# Patient Record
Sex: Female | Born: 1985 | Race: Black or African American | Hispanic: No | Marital: Single | Smoking: Former smoker
Health system: Southern US, Community
[De-identification: ages and names within clinical notes are randomized; demographics above are authoritative.]

## PROBLEM LIST (undated history)

## (undated) ENCOUNTER — Inpatient Hospital Stay (HOSPITAL_COMMUNITY): Payer: Self-pay

## (undated) DIAGNOSIS — F329 Major depressive disorder, single episode, unspecified: Secondary | ICD-10-CM

## (undated) DIAGNOSIS — F32A Depression, unspecified: Secondary | ICD-10-CM

## (undated) DIAGNOSIS — N83209 Unspecified ovarian cyst, unspecified side: Secondary | ICD-10-CM

## (undated) DIAGNOSIS — R51 Headache: Secondary | ICD-10-CM

## (undated) DIAGNOSIS — D219 Benign neoplasm of connective and other soft tissue, unspecified: Secondary | ICD-10-CM

---

## 2011-04-22 ENCOUNTER — Emergency Department (HOSPITAL_COMMUNITY)
Admission: EM | Admit: 2011-04-22 | Discharge: 2011-04-22 | Disposition: A | Discharge status: Home / Self Care | Payer: Self-pay | Attending: Emergency Medicine | Admitting: Emergency Medicine

## 2011-04-22 DIAGNOSIS — B9789 Other viral agents as the cause of diseases classified elsewhere: Secondary | ICD-10-CM | POA: Insufficient documentation

## 2011-04-22 DIAGNOSIS — R05 Cough: Secondary | ICD-10-CM | POA: Insufficient documentation

## 2011-04-22 DIAGNOSIS — R059 Cough, unspecified: Secondary | ICD-10-CM | POA: Insufficient documentation

## 2011-04-22 DIAGNOSIS — J029 Acute pharyngitis, unspecified: Secondary | ICD-10-CM | POA: Insufficient documentation

## 2011-04-22 LAB — RAPID STREP SCREEN (MED CTR MEBANE ONLY): Streptococcus, Group A Screen (Direct): NEGATIVE

## 2011-10-27 ENCOUNTER — Other Ambulatory Visit: Payer: Self-pay

## 2011-10-27 ENCOUNTER — Emergency Department (HOSPITAL_COMMUNITY)
Admission: EM | Admit: 2011-10-27 | Discharge: 2011-10-27 | Disposition: A | Discharge status: Home / Self Care | Payer: Self-pay | Attending: Emergency Medicine | Admitting: Emergency Medicine

## 2011-10-27 ENCOUNTER — Emergency Department (HOSPITAL_COMMUNITY): Payer: Self-pay

## 2011-10-27 ENCOUNTER — Encounter (HOSPITAL_COMMUNITY): Payer: Self-pay | Admitting: *Deleted

## 2011-10-27 DIAGNOSIS — R1013 Epigastric pain: Secondary | ICD-10-CM | POA: Insufficient documentation

## 2011-10-27 DIAGNOSIS — R079 Chest pain, unspecified: Secondary | ICD-10-CM | POA: Insufficient documentation

## 2011-10-27 DIAGNOSIS — M549 Dorsalgia, unspecified: Secondary | ICD-10-CM | POA: Insufficient documentation

## 2011-10-27 DIAGNOSIS — Z79899 Other long term (current) drug therapy: Secondary | ICD-10-CM | POA: Insufficient documentation

## 2011-10-27 LAB — COMPREHENSIVE METABOLIC PANEL
Albumin: 3.5 g/dL (ref 3.5–5.2)
Alkaline Phosphatase: 50 U/L (ref 39–117)
BUN: 10 mg/dL (ref 6–23)
CO2: 25 mEq/L (ref 19–32)
Chloride: 106 mEq/L (ref 96–112)
Creatinine, Ser: 0.9 mg/dL (ref 0.50–1.10)
GFR calc Af Amer: >90 mL/min (ref >90)
GFR calc non Af Amer: 88 mL/min — ABNORMAL LOW (ref >90)
Glucose, Bld: 94 mg/dL (ref 70–99)
Potassium: 3.6 mEq/L (ref 3.5–5.1)
Total Bilirubin: 0.2 mg/dL — ABNORMAL LOW (ref 0.3–1.2)

## 2011-10-27 LAB — URINALYSIS, ROUTINE W REFLEX MICROSCOPIC
Bilirubin Urine: NEGATIVE
Ketones, ur: NEGATIVE mg/dL
Leukocytes, UA: NEGATIVE
Nitrite: NEGATIVE
Protein, ur: NEGATIVE mg/dL
pH: 7.5 (ref 5.0–8.0)

## 2011-10-27 LAB — DIFFERENTIAL
Basophils Relative: 1 % (ref 0–1)
Lymphocytes Relative: 43 % (ref 12–46)
Lymphs Abs: 2.5 10*3/uL (ref 0.7–4.0)
Monocytes Absolute: 0.3 10*3/uL (ref 0.1–1.0)
Monocytes Relative: 6 % (ref 3–12)
Neutro Abs: 2.8 10*3/uL (ref 1.7–7.7)
Neutrophils Relative %: 49 % (ref 43–77)

## 2011-10-27 LAB — CBC
HCT: 37.5 % (ref 36.0–46.0)
Hemoglobin: 12.3 g/dL (ref 12.0–15.0)
MCHC: 32.8 g/dL (ref 30.0–36.0)
RBC: 4.23 MIL/uL (ref 3.87–5.11)

## 2011-10-27 LAB — LIPASE, BLOOD: Lipase: 26 U/L (ref 11–59)

## 2011-10-27 MED ORDER — GI COCKTAIL ~~LOC~~
30.0000 mL | Freq: Once | ORAL | Status: AC
  Administered 2011-10-27: 30 mL via ORAL
  Filled 2011-10-27: qty 30

## 2011-10-27 MED ORDER — ONDANSETRON HCL 4 MG/2ML IJ SOLN
4.0000 mg | Freq: Once | INTRAMUSCULAR | Status: AC
  Administered 2011-10-27: 4 mg via INTRAVENOUS
  Filled 2011-10-27: qty 2

## 2011-10-27 MED ORDER — OMEPRAZOLE 20 MG PO CPDR: 20.0000 mg | DELAYED_RELEASE_CAPSULE | Freq: Every day | ORAL | Status: DC

## 2011-10-27 NOTE — ED Notes (Signed)
Patient presents with chest pain, onset last night, patient sts that occurred while eating a broccoli and chicken casserole last pm. Describes as a tight squeezing type pain at tip of sternum and upper abdomen, denies hx of gall stones or sludge, some sob reported and nausea, denies vomiting or diaphoresis.

## 2011-10-27 NOTE — ED Notes (Signed)
Patient feels better. Awaiting reevaluation of md.

## 2011-10-27 NOTE — ED Provider Notes (Signed)
History     CSN: [id number]  Arrival date & time 10/27/11  4098   First MD Initiated Contact with Patient 10/27/11 [phone]      Chief Complaint  Patient presents with  . Chest Pain    (Consider location/radiation/quality/duration/timing/severity/associated sxs/prior treatment) HPI Comments: 26 year old female with epigastric pain and tightness that gets worse when she lays down. This pain started last night she said nothing like this before. It is not associated with any nausea, vomiting, dysuria, hematuria, cough, fever, shortness of breath. She denies any pleuritic nature to the pain. She is not on birth control. Pain is reproducible and upper gastrointestinal or sternum. She denies any injury. No history of gallbladder problems or acid reflux.  The history is provided by the patient.    History reviewed. No pertinent past medical history.  History reviewed. No pertinent past surgical history.  History reviewed. No pertinent family history.  History  Substance Use Topics  . Smoking status: Current Everyday Smoker -- 0.5 packs/day    Types: Cigarettes  . Smokeless tobacco: Not on file  . Alcohol Use: No    OB History    Grav Para Term Preterm Abortions TAB SAB Ect Mult Living                  Review of Systems  Constitutional: Negative for fever and activity change.  HENT: Negative for congestion and rhinorrhea.   Respiratory: Positive for chest tightness. Negative for cough and shortness of breath.   Cardiovascular: Positive for chest pain.  Gastrointestinal: Positive for abdominal pain. Negative for nausea, vomiting and diarrhea.  Genitourinary: Negative for dysuria, vaginal bleeding and vaginal discharge.  Musculoskeletal: Positive for back pain.  Skin: Negative for rash.  Neurological: Negative for headaches.    Allergies  Review of patient's allergies indicates no known allergies.  Home Medications   Current Outpatient Rx  Name Route Sig Dispense Refill    . OMEPRAZOLE 20 MG PO CPDR Oral Take 1 capsule (20 mg total) by mouth daily. 30 capsule 0    BP 107/62  Pulse 54  Resp 23  SpO2 100%  Physical Exam  Constitutional: She is oriented to person, place, and time. She appears well-developed and well-nourished. No distress.  HENT:  Head: Normocephalic and atraumatic.  Mouth/Throat: Oropharynx is clear and moist. No oropharyngeal exudate.  Eyes: Conjunctivae are normal. Pupils are equal, round, and reactive to light.  Neck: Normal range of motion. Neck supple.  Cardiovascular: Normal rate, regular rhythm and normal heart sounds.   Pulmonary/Chest: Effort normal and breath sounds normal. No respiratory distress. She exhibits tenderness.       Tenderness to lower sternum  Abdominal: Soft. There is tenderness. There is no rebound and no guarding.       Epigastric tenderness to palpation, right upper quadrant soft, negative Murphy sign  Musculoskeletal: Normal range of motion. She exhibits no edema and no tenderness.  Neurological: She is alert and oriented to person, place, and time. No cranial nerve deficit.  Skin: Skin is warm.    ED Course  Procedures (including critical care time)  Labs Reviewed  COMPREHENSIVE METABOLIC PANEL - Abnormal; Notable for the following:    Total Bilirubin 0.2 (*)    GFR calc non Af Amer 88 (*)    All other components within normal limits  URINALYSIS, ROUTINE W REFLEX MICROSCOPIC - Abnormal; Notable for the following:    APPearance HAZY (*)    All other components within normal limits  CBC  DIFFERENTIAL  LIPASE, BLOOD  PREGNANCY, URINE   Dg Abd Acute W/chest  10/27/2011  *RADIOLOGY REPORT*  Clinical Data: Epigastric pain.  ACUTE ABDOMEN SERIES (ABDOMEN 2 VIEW & CHEST 1 VIEW)  Comparison: None.  Findings: The chest radiograph demonstrates clear lungs. Heart and mediastinum are within normal limits.  No evidence for free air. There is a small amount of bowel gas in the abdomen.  Stool in the right colon.   One prominent gas filled loop of small bowel in the left upper quadrant is nonspecific.  Calcifications in the left hemi pelvis. There is a 5 mm calcification overlying the medial left kidney.  Findings could represent a renal calculus.  IMPRESSION: 5 mm calcification in the left kidney region.  Findings could represent a renal calculus.  Additional calcifications in left hemi pelvis and cannot exclude a left distal ureter stone.  Nonspecific bowel gas pattern.  No acute chest findings.  Original Report Authenticated By: Richarda Overlie, M.D.     1. Epigastric pain       MDM  Epigastric pain with lower sternal pain. History not consistent for ACS. Concern for gastritis versus GERD versus reflux. PERC negative  Pain improved a GI cocktail. LFTs within normal limits, lipase normal. Urinalysis negative We'll treat with PPI for gastritis/esophagitis and followup with gastroenterology   Date: 10/27/2011  Rate: 64  Rhythm: sinus arrhythmia  QRS Axis: normal  Intervals: normal  ST/T Wave abnormalities: normal  Conduction Disutrbances:none  Narrative Interpretation:   Old EKG Reviewed: none available         Glynn Octave, MD 10/27/11 1721

## 2011-10-27 NOTE — Discharge Instructions (Signed)

## 2011-10-27 NOTE — ED Notes (Signed)
Reports mid chest tightness that started last night, gets worse when she lays down. Denies recent cough or cold. No acute distress noted at triage, ekg being done.

## 2012-03-16 ENCOUNTER — Emergency Department (HOSPITAL_COMMUNITY)
Admission: EM | Admit: 2012-03-16 | Discharge: 2012-03-16 | Disposition: A | Discharge status: Home / Self Care | Payer: Self-pay | Attending: Emergency Medicine | Admitting: Emergency Medicine

## 2012-03-16 ENCOUNTER — Encounter (HOSPITAL_COMMUNITY): Payer: Self-pay | Admitting: Emergency Medicine

## 2012-03-16 DIAGNOSIS — F17200 Nicotine dependence, unspecified, uncomplicated: Secondary | ICD-10-CM | POA: Insufficient documentation

## 2012-03-16 DIAGNOSIS — J029 Acute pharyngitis, unspecified: Secondary | ICD-10-CM | POA: Insufficient documentation

## 2012-03-16 MED ORDER — IBUPROFEN 800 MG PO TABS: 800.0000 mg | ORAL_TABLET | Freq: Three times a day (TID) | ORAL | Status: AC | PRN

## 2012-03-16 MED ORDER — IBUPROFEN 800 MG PO TABS
800.0000 mg | ORAL_TABLET | Freq: Once | ORAL | Status: AC
  Administered 2012-03-16: 800 mg via ORAL
  Filled 2012-03-16: qty 1

## 2012-03-16 NOTE — Discharge Instructions (Signed)
Read the information below.  You may try over the counter products such as Cepacol or Chloraseptic for your sore throat, which will help numb your throat, as well as tylenol and/or ibuprofen for pain.  If you develop high fevers unresponsive to tylenol or ibuprofen (prescribed), if you have any difficulty swallowing or breathing, or are unable to drink fluids or swallow your saliva, return to the ER immediately for a recheck.  You may return to the ER at any time for worsening condition or any new symptoms that concern you.  Pharyngitis, Viral and Bacterial Pharyngitis is soreness (inflammation) or infection of the pharynx. It is also called a sore throat. CAUSES  Most sore throats are caused by viruses and are part of a cold. However, some sore throats are caused by strep and other bacteria. Sore throats can also be caused by post nasal drip from draining sinuses, allergies and sometimes from sleeping with an open mouth. Infectious sore throats can be spread from person to person by coughing, sneezing and sharing cups or eating utensils. TREATMENT  Sore throats that are viral usually last 3-4 days. Viral illness will get better without medications (antibiotics). Strep throat and other bacterial infections will usually begin to get better about 24-48 hours after you begin to take antibiotics. HOME CARE INSTRUCTIONS   If the caregiver feels there is a bacterial infection or if there is a positive strep test, they will prescribe an antibiotic. The full course of antibiotics must be taken. If the full course of antibiotic is not taken, you or your child may become ill again. If you or your child has strep throat and do not finish all of the medication, serious heart or kidney diseases may develop.   Drink enough water and fluids to keep your urine clear or pale yellow.   Only take over-the-counter or prescription medicines for pain, discomfort or fever as directed by your caregiver.   Get lots of rest.     Gargle with salt water ( tsp. of salt in a glass of water) as often as every 1-2 hours as you need for comfort.   Hard candies may soothe the throat if individual is not at risk for choking. Throat sprays or lozenges may also be used.  SEEK MEDICAL CARE IF:   Large, tender lumps in the neck develop.   A rash develops.   Green, yellow-brown or bloody sputum is coughed up.   Your baby is older than 3 months with a rectal temperature of 100.5 F (38.1 C) or higher for more than 1 day.  SEEK IMMEDIATE MEDICAL CARE IF:   A stiff neck develops.   You or your child are drooling or unable to swallow liquids.   You or your child are vomiting, unable to keep medications or liquids down.   You or your child has severe pain, unrelieved with recommended medications.   You or your child are having difficulty breathing (not due to stuffy nose).   You or your child are unable to fully open your mouth.   You or your child develop redness, swelling, or severe pain anywhere on the neck.   You have a fever.   Your baby is older than 3 months with a rectal temperature of 102 F (38.9 C) or higher.   Your baby is 72 months old or younger with a rectal temperature of 100.4 F (38 C) or higher.  MAKE SURE YOU:   Understand these instructions.   Will watch your condition.  Will get help right away if you are not doing well or get worse.  Document Released: 09/23/2005 Document Revised: 09/12/2011 Document Reviewed: 12/21/2007 [facility] Patient Information 2012 [place], [place].  Antibiotic Nonuse  Your caregiver felt that the infection or problem was not one that would be helped with an antibiotic. Infections may be caused by viruses or bacteria. Only a caregiver can tell which one of these is the likely cause of an illness. A cold is the most common cause of infection in both adults and children. A cold is a virus. Antibiotic treatment will have no effect on a viral infection. Viruses can  lead to many lost days of work caring for sick children and many missed days of school. Children may catch as many as 10 "colds" or "flus" per year during which they can be tearful, cranky, and uncomfortable. The goal of treating a virus is aimed at keeping the ill person comfortable. Antibiotics are medications used to help the body fight bacterial infections. There are relatively few types of bacteria that cause infections but there are hundreds of viruses. While both viruses and bacteria cause infection they are very different types of germs. A viral infection will typically go away by itself within 7 to 10 days. Bacterial infections may spread or get worse without antibiotic treatment. Examples of bacterial infections are:  Sore throats (like strep throat or tonsillitis).   Infection in the lung (pneumonia).   Ear and skin infections.  Examples of viral infections are:  Colds or flus.   Most coughs and bronchitis.   Sore throats not caused by Strep.   Runny noses.  It is often best not to take an antibiotic when a viral infection is the cause of the problem. Antibiotics can kill off the helpful bacteria that we have inside our body and allow harmful bacteria to start growing. Antibiotics can cause side effects such as allergies, nausea, and diarrhea without helping to improve the symptoms of the viral infection. Additionally, repeated uses of antibiotics can cause bacteria inside of our body to become resistant. That resistance can be passed onto harmful bacterial. The next time you have an infection it may be harder to treat if antibiotics are used when they are not needed. Not treating with antibiotics allows our own immune system to develop and take care of infections more efficiently. Also, antibiotics will work better for [place] when they are prescribed for bacterial infections. Treatments for a child that is ill may include:  Give extra fluids throughout the day to stay hydrated.   Get  plenty of rest.   Only give your child over-the-counter or prescription medicines for pain, discomfort, or fever as directed by your caregiver.   The use of a cool mist humidifier may help stuffy noses.   Cold medications if suggested by your caregiver.  Your caregiver may decide to start you on an antibiotic if:  The problem you were seen for today continues for a longer length of time than expected.   You develop a secondary bacterial infection.  SEEK MEDICAL CARE IF:  Fever lasts longer than 5 days.   Symptoms continue to get worse after 5 to 7 days or become severe.   Difficulty in breathing develops.   Signs of dehydration develop (poor drinking, rare urinating, dark colored urine).   Changes in behavior or worsening tiredness (listlessness or lethargy).  Document Released: 12/02/2001 Document Revised: 09/12/2011 Document Reviewed: 05/31/2009 [facility] Patient Information 2012 [place], [place].

## 2012-03-16 NOTE — ED Provider Notes (Signed)
Medical screening examination/treatment/procedure(s) were performed by non-physician practitioner and as supervising physician I was immediately available for consultation/collaboration.  Mikenzie Mccannon K Linker, MD 03/16/12 1336 

## 2012-03-16 NOTE — ED Provider Notes (Signed)
History     CSN: [id number]  Arrival date & time 03/16/12  4098   First MD Initiated Contact with Patient 03/16/12 [phone]      Chief Complaint  Patient presents with  . Sore Throat    (Consider location/radiation/quality/duration/timing/severity/associated sxs/prior treatment) HPI Comments: Patient reports she woke up with a "scratchy" throat yesterday morning, states it was not too bad yesterday but overnight she developed body aches, nausea, headaches, and worsening throat pain.  Patient has not taken her temperature and has taken nothing for her symptoms.  Denies cough, SOB, nasal congestion, ear pain.  Denies known sick contacts.    The history is provided by the patient.    History reviewed. No pertinent past medical history.  History reviewed. No pertinent past surgical history.  No family history on file.  History  Substance Use Topics  . Smoking status: Current Everyday Smoker -- 0.5 packs/day    Types: Cigarettes  . Smokeless tobacco: Not on file  . Alcohol Use: No    OB History    Grav Para Term Preterm Abortions TAB SAB Ect Mult Living                  Review of Systems  Constitutional: Negative for fever and chills.  HENT: Positive for sore throat. Negative for ear pain, congestion, rhinorrhea and trouble swallowing.   Respiratory: Negative for cough, shortness of breath, wheezing and stridor.   Musculoskeletal: Positive for myalgias.  Neurological: Positive for headaches.    Allergies  Review of patient's allergies indicates no known allergies.  Home Medications  No current outpatient prescriptions on file.  BP 108/59  Pulse 71  Temp(Src) 99.4 F (37.4 C) (Oral)  Resp 20  SpO2 100%  Physical Exam  Constitutional: She is oriented to person, place, and time. She appears well-developed and well-nourished.  HENT:  Head: Normocephalic and atraumatic.  Right Ear: Tympanic membrane normal.  Left Ear: Tympanic membrane normal.  Mouth/Throat: Uvula  is midline. Mucous membranes are not dry. No uvula swelling. Posterior oropharyngeal edema and posterior oropharyngeal erythema present. No oropharyngeal exudate or tonsillar abscesses.  Neck: Trachea normal, normal range of motion and phonation normal. Neck supple. No tracheal tenderness present. No rigidity. No tracheal deviation and normal range of motion present.  Cardiovascular: Normal rate and regular rhythm.   Pulmonary/Chest: Effort normal and breath sounds normal. No stridor.  Lymphadenopathy:    She has cervical adenopathy.  Neurological: She is alert and oriented to person, place, and time.    ED Course  Procedures (including critical care time)   Labs Reviewed  RAPID STREP SCREEN  STREP A DNA PROBE   No results found.   1. Pharyngitis       MDM  Afebrile, nontoxic patient with mild anterior cervical lymphadenopathy and erythematous throat without exudate.  Abscence of cough.  No known fevers.  Strep screen is negative.  Sent for dna probe.  Pt d/c home with symptomatic medications, encouragement for PO intake, given return precautions.  Patient verbalizes understanding and agrees with plan.          Rise Patience, [place] 03/16/12 1315

## 2012-03-16 NOTE — ED Notes (Signed)
Sore throat and body aches  Since yesterday

## 2012-03-17 LAB — STREP A DNA PROBE

## 2012-03-20 ENCOUNTER — Encounter (HOSPITAL_COMMUNITY): Payer: Self-pay | Admitting: Nurse Practitioner

## 2012-03-20 ENCOUNTER — Emergency Department (HOSPITAL_COMMUNITY)
Admission: EM | Admit: 2012-03-20 | Discharge: 2012-03-20 | Disposition: A | Discharge status: Home / Self Care | Payer: Self-pay | Attending: Emergency Medicine | Admitting: Emergency Medicine

## 2012-03-20 DIAGNOSIS — R112 Nausea with vomiting, unspecified: Secondary | ICD-10-CM | POA: Insufficient documentation

## 2012-03-20 DIAGNOSIS — M2540 Effusion, unspecified joint: Secondary | ICD-10-CM | POA: Insufficient documentation

## 2012-03-20 DIAGNOSIS — F17200 Nicotine dependence, unspecified, uncomplicated: Secondary | ICD-10-CM | POA: Insufficient documentation

## 2012-03-20 DIAGNOSIS — R109 Unspecified abdominal pain: Secondary | ICD-10-CM | POA: Insufficient documentation

## 2012-03-20 DIAGNOSIS — M7989 Other specified soft tissue disorders: Secondary | ICD-10-CM

## 2012-03-20 LAB — DIFFERENTIAL
Basophils Absolute: 0.1 10*3/uL (ref 0.0–0.1)
Eosinophils Absolute: 0.2 10*3/uL (ref 0.0–0.7)
Lymphocytes Relative: 43 % (ref 12–46)
Monocytes Relative: 6 % (ref 3–12)
Neutrophils Relative %: 47 % (ref 43–77)

## 2012-03-20 LAB — CBC
MCHC: 33.9 g/dL (ref 30.0–36.0)
RDW: 12.5 % (ref 11.5–15.5)
WBC: 6.8 10*3/uL (ref 4.0–10.5)

## 2012-03-20 LAB — URINALYSIS, ROUTINE W REFLEX MICROSCOPIC
Ketones, ur: 15 mg/dL — AB
Leukocytes, UA: NEGATIVE
Nitrite: NEGATIVE
Protein, ur: NEGATIVE mg/dL
pH: 6.5 (ref 5.0–8.0)

## 2012-03-20 LAB — POCT I-STAT, CHEM 8
HCT: 33 % — ABNORMAL LOW (ref 36.0–46.0)
Hemoglobin: 11.2 g/dL — ABNORMAL LOW (ref 12.0–15.0)
Potassium: 3.2 mEq/L — ABNORMAL LOW (ref 3.5–5.1)
Sodium: 142 mEq/L (ref 135–145)

## 2012-03-20 LAB — POCT PREGNANCY, URINE: Preg Test, Ur: NEGATIVE

## 2012-03-20 MED ORDER — ONDANSETRON 4 MG PO TBDP
8.0000 mg | ORAL_TABLET | Freq: Once | ORAL | Status: AC
  Administered 2012-03-20: 8 mg via ORAL
  Filled 2012-03-20: qty 2

## 2012-03-20 NOTE — ED Notes (Signed)
Pt reports nausea and abd pain as well as L leg swelling x 1 week. Pt denies any injuries to leg, describes it as "Feeling tight."

## 2012-03-20 NOTE — Discharge Instructions (Signed)
Your blood work and urine analysis did not show any significant findings. Keep your legs elevated at home. Drink plenty of fluids. If your leg becomes swollen and does not go down, return to emergency department. Return to ER if chest pain, short of breath, fever, or any other concerning symptoms.   Peripheral Edema You have swelling in your legs (peripheral edema). This swelling is due to excess accumulation of salt and water in your body. Edema may be a sign of heart, kidney or liver disease, or a side effect of a medication. It may also be due to problems in the leg veins. Elevating your legs and using special support stockings may be very helpful, if the cause of the swelling is due to poor venous circulation. Avoid long periods of standing, whatever the cause. Treatment of edema depends on identifying the cause. Chips, pretzels, pickles and other salty foods should be avoided. Restricting salt in your diet is almost always needed. Water pills (diuretics) are often used to remove the excess salt and water from your body via urine. These medicines prevent the kidney from reabsorbing sodium. This increases urine flow. Diuretic treatment may also result in lowering of potassium levels in your body. Potassium supplements may be needed if you have to use diuretics daily. Daily weights can help you keep track of your progress in clearing your edema. You should call your caregiver for follow up care as recommended. SEEK IMMEDIATE MEDICAL CARE IF:   You have increased swelling, pain, redness, or heat in your legs.   You develop shortness of breath, especially when lying down.   You develop chest or abdominal pain, weakness, or fainting.   You have a fever.  Document Released: 10/31/2004 Document Revised: 09/12/2011 Document Reviewed: 10/11/2009 [facility] Patient Information 2012 [place], [place].

## 2012-03-20 NOTE — ED Notes (Signed)
Pt discharged via teach back. 

## 2012-03-20 NOTE — ED Provider Notes (Signed)
History     CSN: [id number]  Arrival date & time 03/20/12  1743   First MD Initiated Contact with Patient 03/20/12 2002      Chief Complaint  Patient presents with  . Abdominal Pain  . Leg Swelling    (Consider location/radiation/quality/duration/timing/severity/associated sxs/prior treatment) Patient is a 26 y.o. female presenting with abdominal pain. The history is provided by the patient.  Abdominal Pain The primary symptoms of the illness include abdominal pain, nausea and vomiting. The primary symptoms of the illness do not include fever, fatigue, shortness of breath, dysuria, vaginal discharge or vaginal bleeding. The current episode started more than 2 days ago. The onset of the illness was gradual.  Symptoms associated with the illness do not include urgency or frequency.  Pt states she has had viral URI for the last week. States this has been assicated with nausea, and at times vomiting. Reports nasal congestion, sore throat. States yesterday noted that her left leg was more swollen and "tight feeling" then right. States this now resolved. Pt reports lower abdominal "cramping." Denies being pregnancy. Last menstrual cycle a week ago. Pt denies recent travel, no recent surgeries, not on birth control. No injury to the leg.   History reviewed. No pertinent past medical history.  History reviewed. No pertinent past surgical history.  History reviewed. No pertinent family history.  History  Substance Use Topics  . Smoking status: Current Everyday Smoker -- 0.2 packs/day    Types: Cigarettes  . Smokeless tobacco: Not on file  . Alcohol Use: No    OB History    Grav Para Term Preterm Abortions TAB SAB Ect Mult Living                  Review of Systems  Constitutional: Negative for fever and fatigue.  HENT: Positive for congestion, sore throat and sinus pressure. Negative for neck pain and neck stiffness.   Respiratory: Negative for chest tightness and shortness of  breath.   Cardiovascular: Negative.   Gastrointestinal: Positive for nausea, vomiting and abdominal pain.  Genitourinary: Negative for dysuria, urgency, frequency, flank pain, vaginal bleeding and vaginal discharge.  Musculoskeletal: Positive for joint swelling.  Skin: Negative.   Neurological: Negative for dizziness, weakness and light-headedness.    Allergies  Review of patient's allergies indicates no known allergies.  Home Medications   Current Outpatient Rx  Name Route Sig Dispense Refill  . IBUPROFEN 800 MG PO TABS Oral Take 1 tablet (800 mg total) by mouth every 8 (eight) hours as needed for pain or fever. 15 tablet 0    BP 137/86  Pulse 83  Temp 98.7 F (37.1 C) (Oral)  Resp 18  Ht 5\' 6"  (1.676 m)  Wt 160 lb (72.576 kg)  BMI 25.82 kg/m2  SpO2 100%  Physical Exam  Nursing note and vitals reviewed. Constitutional: She is oriented to person, place, and time. She appears well-developed and well-nourished. No distress.  HENT:  Head: Normocephalic and atraumatic.  Nose: Rhinorrhea present.  Mouth/Throat: Uvula is midline, oropharynx is clear and moist and mucous membranes are normal.  Neck: Normal range of motion. Neck supple.  Cardiovascular: Normal rate, regular rhythm and normal heart sounds.   Pulmonary/Chest: Effort normal and breath sounds normal. No respiratory distress. She has no wheezes.  Musculoskeletal: She exhibits no edema and no tenderness.       Legs appear normal and equal in size bilaterally with no swelling, redness. No calf tenderness bilaterally. No pain with bilateral ankle dorsiflexion -  negative homan's sign  Lymphadenopathy:    She has no cervical adenopathy.  Neurological: She is alert and oriented to person, place, and time.  Skin: Skin is warm and dry.  Psychiatric: She has a normal mood and affect.    ED Course  Procedures (including critical care time)  Abdomen soft on exam, non tender. No swelling of extremities noted currently. Pt  in no distress. VS all within normal. Labs and UA pending.   Results for orders placed during the hospital encounter of 03/20/12  URINALYSIS, ROUTINE W REFLEX MICROSCOPIC      Component Value Range   Color, Urine YELLOW  YELLOW   APPearance CLEAR  CLEAR   Specific Gravity, Urine 1.020  1.005 - 1.030   pH 6.5  5.0 - 8.0   Glucose, UA NEGATIVE  NEGATIVE mg/dL   Hgb urine dipstick NEGATIVE  NEGATIVE   Bilirubin Urine NEGATIVE  NEGATIVE   Ketones, ur 15 (*) NEGATIVE mg/dL   Protein, ur NEGATIVE  NEGATIVE mg/dL   Urobilinogen, UA 2.0 (*) 0.0 - 1.0 mg/dL   Nitrite NEGATIVE  NEGATIVE   Leukocytes, UA NEGATIVE  NEGATIVE  CBC      Component Value Range   WBC 6.8  4.0 - 10.5 K/uL   RBC 3.72 (*) 3.87 - 5.11 MIL/uL   Hemoglobin 11.1 (*) 12.0 - 15.0 g/dL   HCT 10.2 (*) 72.5 - 36.6 %   MCV 87.9  78.0 - 100.0 fL   MCH 29.8  26.0 - 34.0 pg   MCHC 33.9  30.0 - 36.0 g/dL   RDW 44.0  34.7 - 42.5 %   Platelets 245  150 - 400 K/uL  DIFFERENTIAL      Component Value Range   Neutrophils Relative 47  43 - 77 %   Lymphocytes Relative 43  12 - 46 %   Monocytes Relative 6  3 - 12 %   Eosinophils Relative 3  0 - 5 %   Basophils Relative 1  0 - 1 %   Neutro Abs 3.2  1.7 - 7.7 K/uL   Lymphs Abs 2.9  0.7 - 4.0 K/uL   Monocytes Absolute 0.4  0.1 - 1.0 K/uL   Eosinophils Absolute 0.2  0.0 - 0.7 K/uL   Basophils Absolute 0.1  0.0 - 0.1 K/uL   Smear Review FEW ATYPICAL LYMPHS NOTED    POCT I-STAT, CHEM 8      Component Value Range   Sodium 142  135 - 145 mEq/L   Potassium 3.2 (*) 3.5 - 5.1 mEq/L   Chloride 105  96 - 112 mEq/L   BUN 7  6 - 23 mg/dL   Creatinine, Ser 9.56  0.50 - 1.10 mg/dL   Glucose, Bld 93  70 - 99 mg/dL   Calcium, Ion 3.87  5.64 - 1.32 mmol/L   TCO2 22  0 - 100 mmol/L   Hemoglobin 11.2 (*) 12.0 - 15.0 g/dL   HCT 33.2 (*) 95.1 - 88.4 %  POCT PREGNANCY, URINE      Component Value Range   Preg Test, Ur NEGATIVE  NEGATIVE   No results found.  No significant findings to explain  pt's symptoms. Pt denies vaginal discharge or bleeding. No currently symptoms. Will d/c home. No current leg swelling, doubt DVT. Precautions and symptoms discussed with pt which should prompt her to return.   1. Swelling of left lower extremity   2. Abdominal pain       MDM  Lottie Mussel, PA 03/21/12 [phone]

## 2012-03-20 NOTE — ED Notes (Signed)
Here with 2 minor children, here for lower bilateral abd cramping, "mild", (denies: fever, nvd, back pain, urinary or vaginal sx, bleeding or other sx), does mention "some urinary frequency, but also drinks a lot of soda", last BM yesterday (normal), last ate this evening ("no problems"). L leg unremarkable, not tight, no markings, discoloration or swelling. Skin intact. CMS & ROM intact. pulses and skin temp equal.

## 2012-03-20 NOTE — ED Notes (Signed)
Pt states she is going outside for some fresh air.

## 2012-03-22 NOTE — ED Provider Notes (Signed)
Medical screening examination/treatment/procedure(s) were performed by non-physician practitioner and as supervising physician I was immediately available for consultation/collaboration.  Gerhard Munch, MD 03/22/12 [phone]

## 2012-04-11 ENCOUNTER — Encounter (HOSPITAL_COMMUNITY): Payer: Self-pay | Admitting: Emergency Medicine

## 2012-04-11 ENCOUNTER — Emergency Department (HOSPITAL_COMMUNITY)
Admission: EM | Admit: 2012-04-11 | Discharge: 2012-04-11 | Disposition: A | Discharge status: Home / Self Care | Payer: Self-pay | Attending: Emergency Medicine | Admitting: Emergency Medicine

## 2012-04-11 DIAGNOSIS — M7989 Other specified soft tissue disorders: Secondary | ICD-10-CM | POA: Insufficient documentation

## 2012-04-11 DIAGNOSIS — F17200 Nicotine dependence, unspecified, uncomplicated: Secondary | ICD-10-CM | POA: Insufficient documentation

## 2012-04-11 MED ORDER — ENOXAPARIN SODIUM 100 MG/ML ~~LOC~~ SOLN: 1.5000 mg/kg | Freq: Once | SUBCUTANEOUS | Status: DC

## 2012-04-11 MED ORDER — ENOXAPARIN SODIUM 120 MG/0.8ML ~~LOC~~ SOLN
1.5000 mg/kg | Freq: Once | SUBCUTANEOUS | Status: AC
  Administered 2012-04-11: 110 mg via SUBCUTANEOUS
  Filled 2012-04-11: qty 0.8

## 2012-04-11 NOTE — ED Notes (Signed)
Reviewed the need to come to the ED registration in the morning to have her vascular study done.

## 2012-04-11 NOTE — ED Provider Notes (Signed)
History     CSN: [id number]  Arrival date & time 04/11/12  1453   First MD Initiated Contact with Patient 04/11/12 1849      Chief Complaint  Patient presents with  . Leg Swelling    left leg     The history is provided by the patient.   the patient reports 3 weeks of intermittent left lower leg swelling.  She has no history of DVT or pulmonary embolism.  She's had no recent surgery or long travel.  She reports the swelling worsens with sitting and standing up for long periods of time.  She has discomfort in her left lower leg it radiates up into her thigh.  She denies fevers or chills.  She's had no vomiting or diarrhea.  She denies redness or rash to her left lower extremity.  Her pain is mild at this time.  Nothing improves her symptoms  History reviewed. No pertinent past medical history.  History reviewed. No pertinent past surgical history.  History reviewed. No pertinent family history.  History  Substance Use Topics  . Smoking status: Current Everyday Smoker -- 0.2 packs/day    Types: Cigarettes  . Smokeless tobacco: Not on file  . Alcohol Use: No    OB History    Grav Para Term Preterm Abortions TAB SAB Ect Mult Living                  Review of Systems  All other systems reviewed and are negative.    Allergies  Review of patient's allergies indicates no known allergies.  Home Medications  No current outpatient prescriptions on file.  BP 126/78  Pulse 72  Temp 98.5 F (36.9 C) (Oral)  Resp 14  SpO2 100%  LMP 04/04/2012  Physical Exam  Nursing note and vitals reviewed. Constitutional: She is oriented to person, place, and time. She appears well-developed and well-nourished. No distress.  HENT:  Head: Normocephalic and atraumatic.  Eyes: EOM are normal.  Neck: Normal range of motion.  Cardiovascular: Normal rate, regular rhythm and normal heart sounds.   Pulmonary/Chest: Effort normal and breath sounds normal.  Abdominal: Soft. She exhibits no  distension. There is no tenderness.  Musculoskeletal: Normal range of motion.       Mild left lower extremity swelling as compared to the right.  Normal pulses in her left lower extremity.  There is no erythema warmth or tenderness of her left thigh or left lower leg.  She has full range of motion of her joints.  Neurological: She is alert and oriented to person, place, and time.  Skin: Skin is warm and dry.  Psychiatric: She has a normal mood and affect. Judgment normal.    ED Course  Procedures (including critical care time)  Labs Reviewed - No data to display No results found.   1. Swelling of left lower extremity       MDM  My suspicion is that this is venous insufficiency.  The patient will return in the morning for the lower extremity duplex of her left lower extremity.  She's given a single dose Lovenox in the emergency department.  She has no chest pain shortness breath to suggest pulmonary embolism.  I recommended elevation anti-inflammatories and compression stockings        Lyanne Co, MD 04/11/12 2019

## 2012-04-11 NOTE — ED Notes (Signed)
Pt presents to department for evaluation of L leg pain and swelling. Symptoms ongoing x3 weeks. States pain to L calf that radiates up to thigh area. States she was seen for same recently. 6/10 pain, becomes worse with walking. Pedal pulses present. Sensation intact. Pt ambulatory without difficulty. No bruising/swelling noted upon exam. Pt denies recent injury to leg. She is alert and oriented x4. No signs of distress noted.

## 2012-04-11 NOTE — ED Notes (Signed)
Pt reports 3 week history of left leg swelling. Pt seen here for same and told to return if occurs again. Pt reports swelling occurs after sitting down for 2-3 hours at a time. Pt reports swelling starts at lower leg and radiates to upper thigh. Pt denies shortness of breath. Reports nausea.

## 2012-04-12 ENCOUNTER — Ambulatory Visit (HOSPITAL_COMMUNITY)
Admission: RE | Admit: 2012-04-12 | Discharge: 2012-04-12 | Disposition: A | Discharge status: Home / Self Care | Payer: Self-pay | Source: Ambulatory Visit | Attending: Emergency Medicine | Admitting: Emergency Medicine

## 2012-04-12 DIAGNOSIS — M7989 Other specified soft tissue disorders: Secondary | ICD-10-CM | POA: Insufficient documentation

## 2012-04-12 NOTE — Progress Notes (Signed)
VASCULAR LAB PRELIMINARY  PRELIMINARY  PRELIMINARY  PRELIMINARY  Left lower extremity venous Doppler completed.    Preliminary report:  There is no DVT or SVT noted in the left lower extremity.  Bethan Adamek, 04/12/2012, 8:58 AM

## 2012-05-28 ENCOUNTER — Encounter (HOSPITAL_COMMUNITY): Payer: Self-pay

## 2012-05-28 ENCOUNTER — Emergency Department (HOSPITAL_COMMUNITY)
Admission: EM | Admit: 2012-05-28 | Discharge: 2012-05-29 | Disposition: A | Discharge status: Home / Self Care | Payer: Self-pay | Attending: Emergency Medicine | Admitting: Emergency Medicine

## 2012-05-28 DIAGNOSIS — R112 Nausea with vomiting, unspecified: Secondary | ICD-10-CM | POA: Insufficient documentation

## 2012-05-28 LAB — URINALYSIS, ROUTINE W REFLEX MICROSCOPIC
Glucose, UA: NEGATIVE mg/dL
Hgb urine dipstick: NEGATIVE
Leukocytes, UA: NEGATIVE
Protein, ur: NEGATIVE mg/dL
Specific Gravity, Urine: 1.027 (ref 1.005–1.030)
Urobilinogen, UA: 1 mg/dL (ref 0.0–1.0)

## 2012-05-28 LAB — POCT PREGNANCY, URINE: Preg Test, Ur: NEGATIVE

## 2012-05-28 NOTE — ED Notes (Signed)
Pt updated on care 

## 2012-05-28 NOTE — ED Notes (Signed)
Called pt in triage and main waiting room with no answer

## 2012-05-28 NOTE — ED Notes (Signed)
Pt reports N/V/D starting Tuesday 0230 am, pt reports nausea returned today after eating breakfast. Pt denies abd pain

## 2012-07-13 ENCOUNTER — Encounter (HOSPITAL_COMMUNITY): Payer: Self-pay | Admitting: *Deleted

## 2012-07-13 ENCOUNTER — Emergency Department (HOSPITAL_COMMUNITY)
Admission: EM | Admit: 2012-07-13 | Discharge: 2012-07-13 | Disposition: A | Discharge status: Home / Self Care | Payer: Self-pay | Attending: Emergency Medicine | Admitting: Emergency Medicine

## 2012-07-13 DIAGNOSIS — L259 Unspecified contact dermatitis, unspecified cause: Secondary | ICD-10-CM | POA: Insufficient documentation

## 2012-07-13 DIAGNOSIS — F17200 Nicotine dependence, unspecified, uncomplicated: Secondary | ICD-10-CM | POA: Insufficient documentation

## 2012-07-13 DIAGNOSIS — L309 Dermatitis, unspecified: Secondary | ICD-10-CM

## 2012-07-13 NOTE — ED Notes (Signed)
No difficulty breathing 

## 2012-07-13 NOTE — Discharge Instructions (Signed)
 Contact Dermatitis Contact dermatitis is a reaction to certain substances that touch the skin. Contact dermatitis can be either irritant contact dermatitis or allergic contact dermatitis. Irritant contact dermatitis does not require previous exposure to the substance for a reaction to occur.Allergic contact dermatitis only occurs if you have been exposed to the substance before. Upon a repeat exposure, your body reacts to the substance.  CAUSES  Many substances can cause contact dermatitis. Irritant dermatitis is most commonly caused by repeated exposure to mildly irritating substances, such as:  Makeup.  Soaps.  Detergents.  Bleaches.  Acids.  Metal salts, such as nickel. Allergic contact dermatitis is most commonly caused by exposure to:  Poisonous plants.  Chemicals (deodorants, shampoos).  Jewelry.  Latex.  Neomycin in triple antibiotic cream.  Preservatives in products, including clothing. SYMPTOMS  The area of skin that is exposed may develop:  Dryness or flaking.  Redness.  Cracks.  Itching.  Pain or a burning sensation.  Blisters. With allergic contact dermatitis, there may also be swelling in areas such as the eyelids, mouth, or genitals.  DIAGNOSIS  Your caregiver can usually tell what the problem is by doing a physical exam. In cases where the cause is uncertain and an allergic contact dermatitis is suspected, a patch skin test may be performed to help determine the cause of your dermatitis. TREATMENT Treatment includes protecting the skin from further contact with the irritating substance by avoiding that substance if possible. Barrier creams, powders, and gloves may be helpful. Your caregiver may also recommend:  Steroid creams or ointments applied 2 times daily. For best results, soak the rash area in cool water for 20 minutes. Then apply the medicine. Cover the area with a plastic wrap. You can store the steroid cream in the refrigerator for a chilly  effect on your rash. That may decrease itching. Oral steroid medicines may be needed in more severe cases.  Antibiotics or antibacterial ointments if a skin infection is present.  Antihistamine lotion or an antihistamine taken by mouth to ease itching.  Lubricants to keep moisture in your skin.  Burow's solution to reduce redness and soreness or to dry a weeping rash. Mix one packet or tablet of solution in 2 cups cool water. Dip a clean washcloth in the mixture, wring it out a bit, and put it on the affected area. Leave the cloth in place for 30 minutes. Do this as often as possible throughout the day.  Taking several cornstarch or baking soda baths daily if the area is too large to cover with a washcloth. Harsh chemicals, such as alkalis or acids, can cause skin damage that is like a burn. You should flush your skin for 15 to 20 minutes with cold water after such an exposure. You should also seek immediate medical care after exposure. Bandages (dressings), antibiotics, and pain medicine may be needed for severely irritated skin.  HOME CARE INSTRUCTIONS  Avoid the substance that caused your reaction.  Keep the area of skin that is affected away from hot water, soap, sunlight, chemicals, acidic substances, or anything else that would irritate your skin.  Do not scratch the rash. Scratching may cause the rash to become infected.  You may take cool baths to help stop the itching.  Only take over-the-counter or prescription medicines as directed by your caregiver.  See your caregiver for follow-up care as directed to make sure your skin is healing properly. SEEK MEDICAL CARE IF:   Your condition is not better after 3  days of treatment.  You seem to be getting worse.  You see signs of infection such as swelling, tenderness, redness, soreness, or warmth in the affected area.  You have any problems related to your medicines. Document Released: 09/20/2000 Document Revised: 12/16/2011  Document Reviewed: 02/26/2011 [facility] Patient Information 2013 [place], [place].      Use hydrocortisone cream and benadryl cream for itching.  You may take 1-2 tablets of benadryl for itching every 6 hours as needed.  It may cause drowsiness so use with caution.

## 2012-07-13 NOTE — ED Notes (Signed)
The pt has had hives since 1100am today after her break  She has hives on her lt flank and down her lt thigh

## 2012-07-13 NOTE — ED Provider Notes (Signed)
History   This chart was scribed for Misty Shaw. Misty Lamas, MD by Lynelle Smoke. The patient was seen in room TR06C/TR06C. Patient's care was started at 1917.   CSN: [id number]  Arrival date & time 07/13/12  2956   None     Chief Complaint  Patient presents with  . Urticaria    (Consider location/radiation/quality/duration/timing/severity/associated sxs/prior treatment) The history is provided by the patient. No language interpreter was used.  Misty Shaw is a 26 y.o. female who presents to the Emergency Department complaining of a allergic reaction on the left side of her lower abdomin and down her left upper thigh onset this afternoon at work. An associated symptom of the rash is itching. Pt denies smoking and alcohol use.    History reviewed. No pertinent past medical history.  History reviewed. No pertinent past surgical history.  History reviewed. No pertinent family history.  History  Substance Use Topics  . Smoking status: Current Every Day Smoker -- 0.2 packs/day    Types: Cigarettes  . Smokeless tobacco: Not on file  . Alcohol Use: No    OB History    Grav Para Term Preterm Abortions TAB SAB Ect Mult Living                  Review of Systems  Constitutional: Negative for fever and chills.  Gastrointestinal: Negative for nausea and vomiting.  Skin: Positive for rash.    Allergies  Review of patient's allergies indicates no known allergies.  Home Medications  No current outpatient prescriptions on file.  Triage Vitals: BP 111/62  Pulse 77  Temp 98.3 F (36.8 C) (Oral)  Resp 18  SpO2 100%  LMP 06/29/2012  Physical Exam  Nursing note and vitals reviewed. Constitutional: She is oriented to person, place, and time. She appears well-developed and well-nourished. No distress.  HENT:  Head: Normocephalic and atraumatic.  Eyes: EOM are normal.  Neck: Neck supple. No tracheal deviation present.  Cardiovascular: Normal rate.   Pulmonary/Chest:  Effort normal. No respiratory distress.  Musculoskeletal: Normal range of motion.  Neurological: She is alert and oriented to person, place, and time.  Skin: Skin is warm and dry.       Multiple discrete raised erythematous pruritic lesions measuring less than 0.5 cm located left flank and left lateral hip.  Psychiatric: She has a normal mood and affect. Her behavior is normal.    ED Course  Procedures (including critical care time) DIAGNOSTIC STUDIES: Oxygen Saturation is 100% on room air, normal by my interpretation.    COORDINATION OF CARE:  8:45PM- Patient informed of clinical course, understand medical decision-making process, and agree with plan.   Labs Reviewed - No data to display No results found.   1. Dermatitis       MDM  I personally performed the services described in this documentation, which was scribed in my presence. The recorded information has been reviewed and considered.  No evidence of systemic reaction.  No SOB, resp distress.  Possibly insect bites or local dermatitis and focal allergic reaction.  I think amenable to topical hydrocortisone which pt is agreeable to.     Misty Shaw. Eliyas Suddreth, MD 07/14/12 2130

## 2012-07-14 ENCOUNTER — Encounter (HOSPITAL_COMMUNITY): Payer: Self-pay | Admitting: Emergency Medicine

## 2012-07-24 ENCOUNTER — Emergency Department (HOSPITAL_COMMUNITY)
Admission: EM | Admit: 2012-07-24 | Discharge: 2012-07-24 | Disposition: A | Discharge status: Home / Self Care | Payer: Self-pay | Attending: Emergency Medicine | Admitting: Emergency Medicine

## 2012-07-24 ENCOUNTER — Encounter (HOSPITAL_COMMUNITY): Payer: Self-pay

## 2012-07-24 DIAGNOSIS — N760 Acute vaginitis: Secondary | ICD-10-CM

## 2012-07-24 DIAGNOSIS — K5900 Constipation, unspecified: Secondary | ICD-10-CM | POA: Insufficient documentation

## 2012-07-24 DIAGNOSIS — F17200 Nicotine dependence, unspecified, uncomplicated: Secondary | ICD-10-CM | POA: Insufficient documentation

## 2012-07-24 LAB — URINALYSIS, ROUTINE W REFLEX MICROSCOPIC
Bilirubin Urine: NEGATIVE
Ketones, ur: NEGATIVE mg/dL
Leukocytes, UA: NEGATIVE
Nitrite: NEGATIVE
Specific Gravity, Urine: 1.014 (ref 1.005–1.030)
Urobilinogen, UA: 1 mg/dL (ref 0.0–1.0)
pH: 7 (ref 5.0–8.0)

## 2012-07-24 LAB — WET PREP, GENITAL: Trich, Wet Prep: NONE SEEN

## 2012-07-24 MED ORDER — IBUPROFEN 600 MG PO TABS: 600.0000 mg | ORAL_TABLET | Freq: Four times a day (QID) | ORAL | Status: DC | PRN

## 2012-07-24 MED ORDER — METRONIDAZOLE 500 MG PO TABS: 500.0000 mg | ORAL_TABLET | Freq: Two times a day (BID) | ORAL | Status: DC

## 2012-07-24 MED ORDER — IBUPROFEN 200 MG PO TABS
600.0000 mg | ORAL_TABLET | Freq: Once | ORAL | Status: AC
  Administered 2012-07-24: 600 mg via ORAL
  Filled 2012-07-24: qty 3

## 2012-07-24 MED ORDER — POLYETHYLENE GLYCOL 3350 17 G PO PACK: 17.0000 g | PACK | Freq: Every day | ORAL | Status: DC | PRN

## 2012-07-24 NOTE — ED Provider Notes (Signed)
I saw and evaluated the patient, reviewed the resident's note and I agree with the findings and plan.   .Face to face Exam:  General:  Awake HEENT:  Atraumatic Resp:  Normal effort Abd:  Nondistended Neuro:No focal weakness Lymph: No adenopathy   Nelia Shi, MD 07/24/12 1732

## 2012-07-24 NOTE — ED Notes (Addendum)
Pt c/o abdominal cramping x 2 days.  Her period ended 3 days ago.  Has had clear vaginal d/c since as well.  Has been having urinary frequency x last 2-3 weeks. Also c/o h/a's

## 2012-07-24 NOTE — ED Provider Notes (Addendum)
History     CSN: [id number]  Arrival date & time 07/24/12  1506   First MD Initiated Contact with Patient 07/24/12 1530      Chief Complaint  Patient presents with  . Abdominal Cramping  . Vaginal Discharge    clear in color    (Consider location/radiation/quality/duration/timing/severity/associated sxs/prior treatment) HPI  26yo AAF w/ 2 day h/o lower abdominla pain and clear vaginal discharge. Pt finished menstrual period 4 days ago. Pain is crampy in nature and is relieved w/ rest adn loose fitting clothes. Worse w/ sitting and movement while at work. Some nausea when pain is at its worst. Constipation w/ BM Q 2-3 days between. BM today. Denies any vaginal pain, h/o STD, new partners. Pt is sexually active but does not protect against STDs. Has not tried anything for pain. Denies sick contacts or fmhx of abdominal conditions.  History reviewed. No pertinent past medical history.  History reviewed. No pertinent past surgical history.  History reviewed. No pertinent family history.  History  Substance Use Topics  . Smoking status: Current Every Day Smoker -- 0.2 packs/day    Types: Cigarettes  . Smokeless tobacco: Not on file  . Alcohol Use: No    OB History    Grav Para Term Preterm Abortions TAB SAB Ect Mult Living                  Review of Systems  Constitutional: Negative for fever and appetite change.  Gastrointestinal: Positive for nausea, abdominal pain and constipation. Negative for vomiting, diarrhea and blood in stool.  Genitourinary: Positive for vaginal discharge. Negative for dysuria, urgency, vaginal bleeding, vaginal pain and menstrual problem.  Musculoskeletal: Negative for arthralgias.  Skin: Negative for rash.  All other systems reviewed and are negative.    Allergies  Review of patient's allergies indicates no known allergies.  Home Medications   Current Outpatient Rx  Name Route Sig Dispense Refill  . IBUPROFEN 600 MG PO TABS Oral Take  1 tablet (600 mg total) by mouth every 6 (six) hours as needed for pain. 30 tablet 0  . POLYETHYLENE GLYCOL 3350 PO PACK Oral Take 17 g by mouth daily as needed. 14 each 0    BP 111/68  Pulse 79  Temp 99 F (37.2 C) (Oral)  Resp 16  SpO2 100%  LMP 07/16/2012  Physical Exam  Constitutional: She appears well-developed and well-nourished. No distress.  HENT:  Head: Normocephalic.  Eyes: EOM are normal.  Neck: Normal range of motion.  Cardiovascular: Normal rate, regular rhythm, normal heart sounds and intact distal pulses.   Pulmonary/Chest: Effort normal and breath sounds normal.  Abdominal: Soft. She exhibits no distension.       Mild tenderness on deep palpation w/ stool felt in the L quadrants  Musculoskeletal: Normal range of motion.  Neurological: She is alert.  Skin: Skin is warm and dry. No rash noted. No erythema.  Psychiatric: She has a normal mood and affect. Her behavior is normal.    ED Course  Procedures (including critical care time)  Labs Reviewed  URINALYSIS, ROUTINE W REFLEX MICROSCOPIC - Abnormal; Notable for the following:    APPearance CLOUDY (*)     All other components within normal limits  WET PREP, GENITAL - Abnormal; Notable for the following:    Clue Cells Wet Prep HPF POC FEW (*)     WBC, Wet Prep HPF POC RARE (*)     All other components within normal limits  GC/CHLAMYDIA  PROBE AMP, GENITAL  HIV ANTIBODY (ROUTINE TESTING)  RPR   No results found.   1. Constipation       MDM  4:42pm 26yo AAF w/ abdominal pain and vaginal discharge. Concern for constipation vs STD vs gastroenteritis. UA not suggestive of UTI - GC/Chl, RPR, HIV, Wet prep   5:33 PM Wet prep consistent w/ BV - [facility] - other + labs to be called into pt. - Pt to use miralax prn to increase BM to daily - handout given          Ozella Rocks, MD 07/24/12 1730  Ozella Rocks, MD 07/24/12 [phone]

## 2012-07-24 NOTE — ED Notes (Signed)
Pt stable at time of discharge; ambulated out of ER; papers given and stated understanding

## 2012-07-24 NOTE — ED Provider Notes (Signed)
I saw and evaluated the patient, reviewed the resident's note and I agree with the findings and plan.   .Face to face Exam:  General:  Awake HEENT:  Atraumatic Resp:  Normal effort Abd:  Nondistended Neuro:No focal weakness Lymph: No adenopathy   Nelia Shi, MD 07/24/12 1736

## 2012-09-03 ENCOUNTER — Emergency Department (HOSPITAL_COMMUNITY)
Admission: EM | Admit: 2012-09-03 | Discharge: 2012-09-03 | Disposition: A | Discharge status: Home / Self Care | Payer: Self-pay | Attending: Emergency Medicine | Admitting: Emergency Medicine

## 2012-09-03 ENCOUNTER — Encounter (HOSPITAL_COMMUNITY): Payer: Self-pay | Admitting: Emergency Medicine

## 2012-09-03 DIAGNOSIS — R21 Rash and other nonspecific skin eruption: Secondary | ICD-10-CM | POA: Insufficient documentation

## 2012-09-03 DIAGNOSIS — L299 Pruritus, unspecified: Secondary | ICD-10-CM | POA: Insufficient documentation

## 2012-09-03 DIAGNOSIS — F17200 Nicotine dependence, unspecified, uncomplicated: Secondary | ICD-10-CM | POA: Insufficient documentation

## 2012-09-03 MED ORDER — CLOTRIMAZOLE-BETAMETHASONE 1-0.05 % EX CREA: TOPICAL_CREAM | CUTANEOUS | Status: DC

## 2012-09-03 NOTE — ED Notes (Signed)
Pt with circular rash to right arm and right hand that thinks may be ringworm that is itching

## 2012-09-03 NOTE — ED Provider Notes (Signed)
History  This chart was scribed for Benny Lennert, MD by Erskine Emery, ED Scribe. This patient was seen in room TR08C/TR08C and the patient's care was started at 12:05.   CSN: [id number]  Arrival date & time 09/03/12  1144   First MD Initiated Contact with Patient 09/03/12 1205      Chief Complaint  Patient presents with  . Rash    (Consider location/radiation/quality/duration/timing/severity/associated sxs/prior Treatment) Misty Shaw is a 26 y.o. female who presents to the Emergency Department complaining of a spreading rash on her right forearm and hand for the last few days. Pt reports she has been putting calomine lotion on the area which dried it out but then it got worse. Pt has no other medical problems. Patient is a 26 y.o. female presenting with rash. The history is provided by the patient. No language interpreter was used.  Rash  This is a new problem. The current episode started more than 2 days ago. The problem has been gradually worsening. The problem is associated with an unknown factor. There has been no fever. The rash is present on the right arm and right hand. The patient is experiencing no pain. Associated symptoms include itching. Treatments tried: calomine lotion. The treatment provided no relief.  Pt has no PCP.  History reviewed. No pertinent past medical history.  History reviewed. No pertinent past surgical history.  History reviewed. No pertinent family history.  History  Substance Use Topics  . Smoking status: Current Every Day Smoker -- 0.2 packs/day    Types: Cigarettes  . Smokeless tobacco: Not on file  . Alcohol Use: Yes    OB History    Grav Para Term Preterm Abortions TAB SAB Ect Mult Living                  Review of Systems  Constitutional: Negative for fever and chills.  Respiratory: Negative for shortness of breath.   Gastrointestinal: Negative for nausea and vomiting.  Skin: Positive for itching and rash.  Neurological:  Negative for weakness.    Allergies  Review of patient's allergies indicates no known allergies.  Home Medications  No current outpatient prescriptions on file.  BP 97/78  Pulse 76  Temp 97.9 F (36.6 C) (Oral)  Resp 20  SpO2 100%  Physical Exam  Nursing note and vitals reviewed. Constitutional: She is oriented to person, place, and time. She appears well-developed.  HENT:  Head: Normocephalic.  Eyes: Conjunctivae normal are normal.  Neck: No tracheal deviation present.  Cardiovascular:  No murmur heard. Musculoskeletal: Normal range of motion.  Neurological: She is oriented to person, place, and time.  Skin: Skin is warm. Rash noted.       Dry papular rash to the base of her right 5th finger and a circular rash to ventral surface of distal forearm. Looks like ringworm.  Psychiatric: She has a normal mood and affect.    ED Course  Procedures (including critical care time) DIAGNOSTIC STUDIES: Oxygen Saturation is 100% on room air, normal by my interpretation.    COORDINATION OF CARE: 12:05--I evaluated the patient and we discussed a treatment plan including medication (Lotrisone cream) and follow up with a primary care physician if the symptoms do not improve to which the pt agreed.    Labs Reviewed - No data to display No results found.   No diagnosis found.    MDM  The chart was scribed for me under my direct supervision.  I personally performed the  history, physical, and medical decision making and all procedures in the evaluation of this patient.Benny Lennert, MD 09/03/12 [phone]

## 2012-09-03 NOTE — ED Notes (Signed)
Pt has rash to right small finger and on lateral aspect of wrist. No pain. Area is itchy. Rash is circular.

## 2012-09-05 ENCOUNTER — Encounter (HOSPITAL_COMMUNITY): Payer: Self-pay | Admitting: Emergency Medicine

## 2012-09-05 ENCOUNTER — Emergency Department (HOSPITAL_COMMUNITY)
Admission: EM | Admit: 2012-09-05 | Discharge: 2012-09-05 | Disposition: A | Discharge status: Home / Self Care | Payer: Self-pay | Attending: Emergency Medicine | Admitting: Emergency Medicine

## 2012-09-05 DIAGNOSIS — R21 Rash and other nonspecific skin eruption: Secondary | ICD-10-CM | POA: Insufficient documentation

## 2012-09-05 DIAGNOSIS — L299 Pruritus, unspecified: Secondary | ICD-10-CM | POA: Insufficient documentation

## 2012-09-05 DIAGNOSIS — F17200 Nicotine dependence, unspecified, uncomplicated: Secondary | ICD-10-CM | POA: Insufficient documentation

## 2012-09-05 DIAGNOSIS — Z79899 Other long term (current) drug therapy: Secondary | ICD-10-CM | POA: Insufficient documentation

## 2012-09-05 MED ORDER — FLUCONAZOLE 200 MG PO TABS: 200.0000 mg | ORAL_TABLET | ORAL | Status: AC

## 2012-09-05 MED ORDER — PREDNISONE 20 MG PO TABS: 40.0000 mg | ORAL_TABLET | Freq: Every day | ORAL | Status: DC

## 2012-09-05 NOTE — ED Provider Notes (Signed)
Medical screening examination/treatment/procedure(s) were performed by non-physician practitioner and as supervising physician I was immediately available for consultation/collaboration.   Mckenzey Parcell R Lisbet Busker, MD 09/05/12 1640 

## 2012-09-05 NOTE — ED Provider Notes (Signed)
History     CSN: [id number]  Arrival date & time 09/05/12  1017   First MD Initiated Contact with Patient 09/05/12 1139      Chief Complaint  Patient presents with  . Rash    (Consider location/radiation/quality/duration/timing/severity/associated sxs/prior treatment) HPI  Misty Shaw is a 26 y.o. female complaining of pruritic rash to right fifth digit x2 days worsening significantly today. Patient states her daughter has ringworm. Denies fever, nausea vomiting.    History reviewed. No pertinent past medical history.  History reviewed. No pertinent past surgical history.  No family history on file.  History  Substance Use Topics  . Smoking status: Current Every Day Smoker -- 0.2 packs/day    Types: Cigarettes  . Smokeless tobacco: Not on file  . Alcohol Use: Yes     Comment: occassionally    OB History    Grav Para Term Preterm Abortions TAB SAB Ect Mult Living                  Review of Systems  Constitutional: Negative for fever.  Respiratory: Negative for shortness of breath.   Cardiovascular: Negative for chest pain.  Gastrointestinal: Negative for nausea, vomiting, abdominal pain and diarrhea.  Skin: Positive for rash.  All other systems reviewed and are negative.    Allergies  Review of patient's allergies indicates no known allergies.  Home Medications   Current Outpatient Rx  Name  Route  Sig  Dispense  Refill  . CLOTRIMAZOLE-BETAMETHASONE 1-0.05 % EX CREA   Topical   Apply 1 application topically 2 (two) times daily. Apply to affected area 2 times daily as needed to hand         . FLUCONAZOLE 200 MG PO TABS   Oral   Take 1 tablet (200 mg total) by mouth once a week.   3 tablet   0   . PREDNISONE 20 MG PO TABS   Oral   Take 2 tablets (40 mg total) by mouth daily.   10 tablet   0     BP 109/73  Pulse 70  Temp 97.5 F (36.4 C) (Oral)  Resp 16  SpO2 100%  LMP 08/05/2012  Physical Exam  Nursing note and vitals  reviewed. Constitutional: She is oriented to person, place, and time. She appears well-developed and well-nourished. No distress.  HENT:  Head: Normocephalic.  Eyes: Conjunctivae normal and EOM are normal.  Cardiovascular: Normal rate.   Pulmonary/Chest: Effort normal. No stridor.  Abdominal: She exhibits no distension.  Musculoskeletal: Normal range of motion.  Neurological: She is alert and oriented to person, place, and time.  Skin:       Papular rash to right fifth digit patient also has a annular scaled rash with central clearing to rule her right wrist.  Psychiatric: She has a normal mood and affect.    ED Course  Procedures (including critical care time)  Labs Reviewed - No data to display No results found.   1. Rash       MDM  Patient was seen for similar 2 days ago and given prescription for Lotrisone cream. Will also start her on fluconazole q. weekly X3. Dermatology referral given  New Prescriptions   FLUCONAZOLE (DIFLUCAN) 200 MG TABLET    Take 1 tablet (200 mg total) by mouth once a week.   PREDNISONE (DELTASONE) 20 MG TABLET    Take 2 tablets (40 mg total) by mouth daily.  Wynetta Emery, PA-C 09/05/12 1210

## 2012-09-05 NOTE — ED Notes (Signed)
Pt presenting to ed with c/o rash to her right pinky finger and with a possible ringworm to her wrist x 2 days. Pt states she has tried otc medications but they are not helping

## 2012-10-03 ENCOUNTER — Emergency Department (HOSPITAL_COMMUNITY)
Admission: EM | Admit: 2012-10-03 | Discharge: 2012-10-03 | Disposition: A | Discharge status: Home / Self Care | Payer: Self-pay | Attending: Emergency Medicine | Admitting: Emergency Medicine

## 2012-10-03 ENCOUNTER — Encounter (HOSPITAL_COMMUNITY): Payer: Self-pay | Admitting: Family Medicine

## 2012-10-03 ENCOUNTER — Emergency Department (HOSPITAL_COMMUNITY): Payer: Self-pay

## 2012-10-03 DIAGNOSIS — R197 Diarrhea, unspecified: Secondary | ICD-10-CM | POA: Insufficient documentation

## 2012-10-03 DIAGNOSIS — Z3490 Encounter for supervision of normal pregnancy, unspecified, unspecified trimester: Secondary | ICD-10-CM

## 2012-10-03 DIAGNOSIS — R110 Nausea: Secondary | ICD-10-CM | POA: Insufficient documentation

## 2012-10-03 DIAGNOSIS — R51 Headache: Secondary | ICD-10-CM | POA: Insufficient documentation

## 2012-10-03 DIAGNOSIS — R109 Unspecified abdominal pain: Secondary | ICD-10-CM | POA: Insufficient documentation

## 2012-10-03 DIAGNOSIS — F17200 Nicotine dependence, unspecified, uncomplicated: Secondary | ICD-10-CM | POA: Insufficient documentation

## 2012-10-03 DIAGNOSIS — Z3201 Encounter for pregnancy test, result positive: Secondary | ICD-10-CM | POA: Insufficient documentation

## 2012-10-03 DIAGNOSIS — M549 Dorsalgia, unspecified: Secondary | ICD-10-CM | POA: Insufficient documentation

## 2012-10-03 LAB — WET PREP, GENITAL: Trich, Wet Prep: NONE SEEN

## 2012-10-03 LAB — HCG, QUANTITATIVE, PREGNANCY: hCG, Beta Chain, Quant, S: 944 m[IU]/mL — ABNORMAL HIGH (ref <5)

## 2012-10-03 LAB — ABO/RH

## 2012-10-03 LAB — BASIC METABOLIC PANEL
BUN: 9 mg/dL (ref 6–23)
GFR calc Af Amer: >90 mL/min (ref >90)
GFR calc non Af Amer: >90 mL/min (ref >90)
Potassium: 3.7 mEq/L (ref 3.5–5.1)
Sodium: 137 mEq/L (ref 135–145)

## 2012-10-03 LAB — CBC
MCHC: 34.3 g/dL (ref 30.0–36.0)
Platelets: 185 10*3/uL (ref 150–400)
RDW: 12.3 % (ref 11.5–15.5)
WBC: 4.1 10*3/uL (ref 4.0–10.5)

## 2012-10-03 LAB — URINALYSIS, ROUTINE W REFLEX MICROSCOPIC
Ketones, ur: NEGATIVE mg/dL
Leukocytes, UA: NEGATIVE
Nitrite: NEGATIVE
Protein, ur: NEGATIVE mg/dL
Urobilinogen, UA: 4 mg/dL — ABNORMAL HIGH (ref 0.0–1.0)

## 2012-10-03 LAB — PREGNANCY, URINE: Preg Test, Ur: POSITIVE — AB

## 2012-10-03 NOTE — ED Notes (Signed)
NAD noted at time of d/c home 

## 2012-10-03 NOTE — ED Notes (Signed)
Patient transported to Ultrasound 

## 2012-10-03 NOTE — ED Provider Notes (Signed)
History     CSN: [id number]  Arrival date & time 10/03/12  7829   First MD Initiated Contact with Patient 10/03/12 [phone]      No chief complaint on file.   (Consider location/radiation/quality/duration/timing/severity/associated sxs/prior treatment) HPI Comments: Patient comes to the ER for evaluation of abdominal discomfort, nausea without vomiting and diarrhea. Symptoms began overnight. She was unable to go to work this morning because of the symptoms. Patient reports that the pain as a throbbing cramping and comes and goes. It does appear to be associated with the diarrhea. She also has a headache. She denies neck pain and stiffness. She has not had a fever. No sore throat, cough or shortness of breath.   No past medical history on file.  No past surgical history on file.  No family history on file.  History  Substance Use Topics  . Smoking status: Current Every Day Smoker -- 0.2 packs/day    Types: Cigarettes  . Smokeless tobacco: Not on file  . Alcohol Use: Yes     Comment: occassionally    OB History    Grav Para Term Preterm Abortions TAB SAB Ect Mult Living                  Review of Systems  Cardiovascular: Negative for chest pain.  Gastrointestinal: Positive for nausea, abdominal pain and diarrhea. Negative for vomiting, abdominal distention and anal bleeding.  Genitourinary: Negative for dysuria and frequency.  Musculoskeletal: Positive for back pain.  All other systems reviewed and are negative.    Allergies  Review of patient's allergies indicates no known allergies.  Home Medications   Current Outpatient Rx  Name  Route  Sig  Dispense  Refill  . CLOTRIMAZOLE-BETAMETHASONE 1-0.05 % EX CREA   Topical   Apply 1 application topically 2 (two) times daily. Apply to affected area 2 times daily as needed to hand           BP 110/70  Pulse 86  Temp 98.1 F (36.7 C) (Oral)  Resp 18  SpO2 100%  LMP 08/05/2012  Physical Exam  Constitutional: She  is oriented to person, place, and time. She appears well-developed and well-nourished. No distress.  HENT:  Head: Normocephalic and atraumatic.  Right Ear: Hearing normal.  Nose: Nose normal.  Mouth/Throat: Oropharynx is clear and moist and mucous membranes are normal.  Eyes: Conjunctivae normal and EOM are normal. Pupils are equal, round, and reactive to light.  Neck: Normal range of motion. Neck supple.  Cardiovascular: Normal rate, regular rhythm, S1 normal and S2 normal.  Exam reveals no gallop and no friction rub.   No murmur heard. Pulmonary/Chest: Effort normal and breath sounds normal. No respiratory distress. She exhibits no tenderness.  Abdominal: Soft. Normal appearance and bowel sounds are normal. There is no hepatosplenomegaly. There is no tenderness. There is no rebound, no guarding, no CVA tenderness, no tenderness at McBurney's point and negative Murphy's sign. No hernia.  Musculoskeletal: Normal range of motion.  Neurological: She is alert and oriented to person, place, and time. She has normal strength. No cranial nerve deficit or sensory deficit. Coordination normal. GCS eye subscore is 4. GCS verbal subscore is 5. GCS motor subscore is 6.  Skin: Skin is warm, dry and intact. No rash noted. No cyanosis.  Psychiatric: She has a normal mood and affect. Her speech is normal and behavior is normal. Thought content normal.    ED Course  Procedures (including critical care time)  Labs Reviewed  PREGNANCY, URINE - Abnormal; Notable for the following:    Preg Test, Ur POSITIVE (*)     All other components within normal limits  URINALYSIS, ROUTINE W REFLEX MICROSCOPIC - Abnormal; Notable for the following:    Urobilinogen, UA 4.0 (*)     All other components within normal limits  CBC - Abnormal; Notable for the following:    HCT 35.6 (*)     All other components within normal limits  HCG, QUANTITATIVE, PREGNANCY - Abnormal; Notable for the following:    hCG, Beta Chain,  Quant, S 944 (*)     All other components within normal limits  BASIC METABOLIC PANEL  ABO/RH  WET PREP, GENITAL  GC/CHLAMYDIA PROBE AMP   US Ob Comp Less 14 Wks  10/03/2012  *RADIOLOGY REPORT*  Clinical Data: Pelvic pain.  4-week-0-day gestational age by LMP.  OBSTETRIC <14 WK [place] AND TRANSVAGINAL OB US  Technique:  Both transabdominal and transvaginal ultrasound examinations were performed for complete evaluation of the gestation as well as the maternal uterus, adnexal regions, and pelvic cul-de-sac.  Transvaginal technique was performed to assess early pregnancy.  Comparison:  None.  Intrauterine gestational sac:  Probable single early I U G S seen Yolk sac: Not visualized Embryo: Not visualized  MSD: 3 mm  5 w 0 d  Maternal uterus/adnexae: A small to moderate subchorionic hemorrhage noted.  Normal appearance of left ovary.  Hyperechoic mass is seen in the right ovary measuring 1.9 cm in maximum diameter.  The right ovarian corpus luteum also noted.  No other adnexal mass or free fluid identified.  IMPRESSION:  1.  Probable early 5-week intrauterine gestational sac, with small to moderate subchorionic hemorrhage.  Recommend continued follow-up of beta HCG levels, with further ultrasound follow-up in 10-14 days to assess pregnancy progression/viability. 2.  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   Original Report Authenticated By: Myles Rosenthal, M.D.    US Ob Transvaginal  10/03/2012  *RADIOLOGY REPORT*  Clinical Data: Pelvic pain.  4-week-0-day gestational age by LMP.  OBSTETRIC <14 WK [place] AND TRANSVAGINAL OB US  Technique:  Both transabdominal and transvaginal ultrasound examinations were performed for complete evaluation of the gestation as well as the maternal uterus, adnexal regions, and pelvic cul-de-sac.  Transvaginal technique was performed to assess early pregnancy.  Comparison:  None.  Intrauterine gestational sac:  Probable single early I U G S seen Yolk sac: Not visualized Embryo: Not  visualized  MSD: 3 mm  5 w 0 d  Maternal uterus/adnexae: A small to moderate subchorionic hemorrhage noted.  Normal appearance of left ovary.  Hyperechoic mass is seen in the right ovary measuring 1.9 cm in maximum diameter.  The right ovarian corpus luteum also noted.  No other adnexal mass or free fluid identified.  IMPRESSION:  1.  Probable early 5-week intrauterine gestational sac, with small to moderate subchorionic hemorrhage.  Recommend continued follow-up of beta HCG levels, with further ultrasound follow-up in 10-14 days to assess pregnancy progression/viability. 2.  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   Original Report Authenticated By: Myles Rosenthal, M.D.      No diagnosis found.    MDM  Patient presented to the ER for evaluation of nausea with lower abdominal cramping. She had some diarrhea as well. Initially it was felt that she might have some viral illness, but her urine pregnancy was positive and therefore further workup was performed. Workup is consistent with early intrauterine pregnancy. Vital signs are stable and  evaluation is unremarkable otherwise. Patient is to followup with OB/GYN. Start prenatal vitamins (discussed with patient). Return to the ER if her symptoms worsen.        Misty Crease, MD 10/03/12 1213

## 2012-10-03 NOTE — ED Notes (Signed)
sts that she has had abdominal cramping with N,V,D for a few days. Denies bleeding.

## 2012-10-04 LAB — GC/CHLAMYDIA PROBE AMP: CT Probe RNA: NEGATIVE

## 2012-10-30 ENCOUNTER — Inpatient Hospital Stay (HOSPITAL_COMMUNITY)
Admission: AD | Admit: 2012-10-30 | Discharge: 2012-10-30 | Disposition: A | Discharge status: Home / Self Care | Payer: Medicaid Other | Source: Ambulatory Visit | Attending: Obstetrics & Gynecology | Admitting: Obstetrics & Gynecology

## 2012-10-30 ENCOUNTER — Inpatient Hospital Stay (HOSPITAL_COMMUNITY): Payer: Medicaid Other

## 2012-10-30 ENCOUNTER — Encounter (HOSPITAL_COMMUNITY): Payer: Self-pay | Admitting: *Deleted

## 2012-10-30 DIAGNOSIS — O021 Missed abortion: Secondary | ICD-10-CM | POA: Insufficient documentation

## 2012-10-30 DIAGNOSIS — O219 Vomiting of pregnancy, unspecified: Secondary | ICD-10-CM

## 2012-10-30 HISTORY — DX: Depression, unspecified: F32.A

## 2012-10-30 HISTORY — DX: Major depressive disorder, single episode, unspecified: F32.9

## 2012-10-30 LAB — URINALYSIS, ROUTINE W REFLEX MICROSCOPIC
Ketones, ur: 15 mg/dL — AB
Protein, ur: NEGATIVE mg/dL
Urobilinogen, UA: 1 mg/dL (ref 0.0–1.0)

## 2012-10-30 LAB — CBC
HCT: 35.2 % — ABNORMAL LOW (ref 36.0–46.0)
Hemoglobin: 12.1 g/dL (ref 12.0–15.0)
MCV: 88.9 fL (ref 78.0–100.0)
RDW: 12.1 % (ref 11.5–15.5)
WBC: 7.3 10*3/uL (ref 4.0–10.5)

## 2012-10-30 LAB — URINE MICROSCOPIC-ADD ON

## 2012-10-30 MED ORDER — PROMETHAZINE HCL 25 MG PO TABS
25.0000 mg | ORAL_TABLET | Freq: Four times a day (QID) | ORAL | Status: DC | PRN
Start: 2012-10-30 — End: 2012-11-04

## 2012-10-30 MED ORDER — MISOPROSTOL 200 MCG PO TABS: 800.0000 ug | ORAL_TABLET | Freq: Once | ORAL | Status: DC

## 2012-10-30 MED ORDER — PROMETHAZINE HCL 25 MG/ML IJ SOLN
25.0000 mg | Freq: Once | INTRAVENOUS | Status: AC
  Administered 2012-10-30: 25 mg via INTRAVENOUS
  Filled 2012-10-30: qty 1

## 2012-10-30 MED ORDER — KETOROLAC TROMETHAMINE 10 MG PO TABS: 10.0000 mg | ORAL_TABLET | Freq: Four times a day (QID) | ORAL | Status: DC | PRN

## 2012-10-30 NOTE — MAU Provider Note (Signed)
Chief Complaint: Morning Sickness  First Provider Initiated Contact with Patient 10/30/12 2038      SUBJECTIVE HPI: Misty Shaw is a 27 y.o. [phone] at [redacted]w[redacted]d by LMP who presents with N/Vx a few weeks. Worsening over the past 2 days. Vomited 10 x today. No fever, chills, diarrhea, sick contacts, VB, vaginal discharge, urinary complaints.. Has experienced mild low abd cramping x a few days. Had hyperemesis in previous pregnancies. Has not started [facility]. Has not tried anything for Sx.   [place] 10/03/12 showed possible 5 week GS w/ small-mod SCH. No F/U since.   Past Medical History  Diagnosis Date  . Hypertension   . Depression     PPD after 2010 delivery   OB History    Grav Para Term Preterm Abortions TAB SAB Ect Mult Living   4 3 3       3      # Outc Date GA Lbr Len/2nd Wgt Sex Del Anes PTL Lv   1 TRM 2001 [redacted]w[redacted]d   M SVD EPI No Yes   2 TRM 2009 [redacted]w[redacted]d   F SVD None No Yes   3 TRM 2010 [redacted]w[redacted]d   F SVD EPI No Yes   4 CUR              Past Surgical History  Procedure Date  . No past surgeries    History   Social History  . Marital Status: Single    Spouse Name: N/A    Number of Children: N/A  . Years of Education: N/A   Occupational History  . Not on file.   Social History Main Topics  . Smoking status: Former Smoker -- 0.2 packs/day    Types: Cigarettes    Quit date: 09/29/2012  . Smokeless tobacco: Not on file  . Alcohol Use: Yes     Comment: occassionally  . Drug Use: No  . Sexually Active: Yes    Birth Control/ Protection: None   Other Topics Concern  . Not on file   Social History Narrative  . No narrative on file   No current facility-administered medications on file prior to encounter.   Current Outpatient Prescriptions on File Prior to Encounter None   No Known Allergies  ROS: Pertinent items in HPI  OBJECTIVE Blood pressure 115/69, pulse 77, temperature 98.1 F (36.7 C), temperature source Oral, resp. rate 20, height 5\' 5"  (1.651 m), weight  66.044 kg (145 lb 9.6 oz), last menstrual period 09/05/2012. GENERAL: Well-developed, well-nourished female in no acute distress. Weak-appearing.  HEENT: Normocephalic. Mucus membranes moist.  HEART: normal rate RESP: normal effort ABDOMEN: Soft, non-tender EXTREMITIES: Nontender, no edema NEURO: Alert and oriented SPECULUM EXAM: deferred  LAB RESULTS Results for orders placed during the hospital encounter of 10/30/12 (from the past 24 hour(s))  URINALYSIS, ROUTINE W REFLEX MICROSCOPIC     Status: Abnormal   Collection Time   10/30/12  7:30 PM      Component Value Range   Color, Urine YELLOW  YELLOW   APPearance CLEAR  CLEAR   Specific Gravity, Urine >1.030 (*) 1.005 - 1.030   pH 6.0  5.0 - 8.0   Glucose, UA NEGATIVE  NEGATIVE mg/dL   Hgb urine dipstick TRACE (*) NEGATIVE   Bilirubin Urine NEGATIVE  NEGATIVE   Ketones, ur 15 (*) NEGATIVE mg/dL   Protein, ur NEGATIVE  NEGATIVE mg/dL   Urobilinogen, UA 1.0  0.0 - 1.0 mg/dL   Nitrite NEGATIVE  NEGATIVE   Leukocytes, UA  NEGATIVE  NEGATIVE  URINE MICROSCOPIC-ADD ON     Status: Abnormal   Collection Time   10/30/12  7:30 PM      Component Value Range   Squamous Epithelial / LPF FEW (*) RARE   WBC, UA 0-2  <3 WBC/hpf   RBC / HPF 3-6  <3 RBC/hpf   Bacteria, UA FEW (*) RARE  CBC     Status: Abnormal   Collection Time   10/30/12 11:06 PM      Component Value Range   WBC 7.3  4.0 - 10.5 K/uL   RBC 3.96  3.87 - 5.11 MIL/uL   Hemoglobin 12.1  12.0 - 15.0 g/dL   HCT 81.1 (*) 91.4 - 78.2 %   MCV 88.9  78.0 - 100.0 fL   MCH 30.6  26.0 - 34.0 pg   MCHC 34.4  30.0 - 36.0 g/dL   RDW 95.6  21.3 - 08.6 %   Platelets 207  150 - 400 K/uL    IMAGING US Ob Transvaginal  10/30/2012  *RADIOLOGY REPORT*  Clinical Data: Pelvic pain.  TRANSVAGINAL OBSTETRIC US  Technique:  Transvaginal ultrasound was performed for complete evaluation of the gestation as well as the maternal uterus, adnexal regions, and pelvic cul-de-sac.  Comparison:  Pelvic  ultrasound performed 10/03/2012  Intrauterine gestational sac: Visualized; normal in shape. Yolk sac: Yes Embryo: Yes Cardiac Activity: No Heart Rate: N/A  CRL: 1.25 cm           7   w  4   d  Subchorionic hemorrhage: A small amount of subchorionic hemorrhage is noted.  Maternal uterus/adnexae: The uterus is otherwise unremarkable in appearance.  The right ovary measures 3.1 x 1.9 x 1.8 cm, while the left ovary measures 3.6 x 1.5 x 1.6 cm.  A 2.1 cm echogenic dermoid cyst is noted at the right adnexa; scattered small foci of increased echogenicity at the left adnexa may reflect calcifications or possibly tiny dermoid cysts.  No free fluid is seen within the pelvic cul-de-sac.  IMPRESSION:  1.  Single intrauterine gestational sac noted.  The embryo measures 1.3 cm in crown-rump length, but no cardiac activity is visualized. This is compatible with fetal demise.  Small amount of subchorionic hemorrhage noted. 2.  2.1 cm dermoid cyst at the right adnexa; scattered small foci of increased echogenicity at the left adnexa may reflect calcifications or possibly tiny dermoid cysts.  These results were called by telephone on 10/30/2012 at 11:06 p.m. to [place] NP, who verbally acknowledged these results.   Original Report Authenticated By: Tonia Ghent, M.D.    MAU COURSE N/V resolved w/ Phenergan and IV fluids. Tolerating POs.  Notified pt of fetal demise. Pt tearful, appropriate. Discussed management options of expectant management, Cytotec and D&C. Pt undecided. Pt is good candidate for Cytotec. Would like Rx in case she decides to use it. May want to have repeat US in one week to confirm demise.   ASSESSMENT 1. Missed abortion   2. Nausea/vomiting in pregnancy    PLAN Discharge home. Support given. Bleeding precautions.     Follow-up Information    Follow up with [facility]. In 1 week.   Contact information:   [address] [place] [address] [phone]        Follow up with [facility] [place] [facility]. (As needed if symptoms worsen)    Contact information:   [address] [id number] mc [place] [address]  [phone]          Medication List     As of 10/31/2012  2:34 AM    TAKE these medications         ketorolac 10 MG tablet   Commonly known as: TORADOL   Take 1 tablet (10 mg total) by mouth every 6 (six) hours as needed for pain.      misoprostol 200 MCG tablet   Commonly known as: CYTOTEC   Take 4 tablets (800 mcg total) by mouth once. Place all 4 tablets in the back of your vagina.      promethazine 25 MG tablet   Commonly known as: PHENERGAN   Take 1 tablet (25 mg total) by mouth every 6 (six) hours as needed for nausea.         [place], CNM 10/30/2012  11:42 PM

## 2012-10-30 NOTE — Progress Notes (Signed)
Misty Shaw  CNM in earlier to discuss u/s results and d/c plan. Written and verbal d/c instructions given and understanding voiced

## 2012-10-30 NOTE — MAU Note (Signed)
Patient presents to MAU with c/o N/V x 2 days; has been unable to begin prenatal care waiting for pregnancy Medicaid to be verified. Just wants to make sure everything is okay.  Reports she has thrown up approximately 10 times since 0200 today. Denies diarrhea, fever, chills, or headache.  Reports she had hyperemesis with last two pregnancies and took Dramamine which relieved her N/V.

## 2012-10-30 NOTE — MAU Note (Signed)
Unable to keep down anything for 2 days. Alittle bit of carmping

## 2012-11-02 ENCOUNTER — Other Ambulatory Visit: Payer: Self-pay | Admitting: Advanced Practice Midwife

## 2012-11-02 DIAGNOSIS — R109 Unspecified abdominal pain: Secondary | ICD-10-CM

## 2012-11-02 NOTE — Progress Notes (Signed)
Pt called to report discomfort with taking Cytotec for possible missed AB.  Pt desires expectant management at this time.  She denies bleeding or abdominal pain on the phone today.  Outpatient U/S for viability ordered for 1 week from previous U/S to confirm.  She has prescription for Cytotec if missed AB confirmed.

## 2012-11-04 ENCOUNTER — Inpatient Hospital Stay (HOSPITAL_COMMUNITY): Payer: Medicaid Other

## 2012-11-04 ENCOUNTER — Inpatient Hospital Stay (HOSPITAL_COMMUNITY)
Admission: AD | Admit: 2012-11-04 | Discharge: 2012-11-04 | Disposition: A | Discharge status: Home / Self Care | Payer: Medicaid Other | Source: Ambulatory Visit | Attending: Obstetrics & Gynecology | Admitting: Obstetrics & Gynecology

## 2012-11-04 DIAGNOSIS — O021 Missed abortion: Secondary | ICD-10-CM | POA: Insufficient documentation

## 2012-11-04 NOTE — MAU Provider Note (Signed)
  History     CSN: [id number]  Arrival date and time: 11/04/12 1421   None     No chief complaint on file.  HPI This is a 27 y.o. female at [redacted]w[redacted]d who presents with concerns over Cytotec not working when she inserted it on Monday. Is also nervous about using it again and wants to make sure the diagnosis is sure.  Has a clinic appt on the 10th, not D&C.    RN Note: Patient is in with concerns that the cytotec that she placed vaginally on Monday did not work and feels like the first ultrasound may be incorrect. She states that have not and is not having any cramping, pain or vaginal bleeding. She states that she a D & C scheduled on feb. 10th. She states that she still feel all the pregnancy symptoms such as morning nausea.       OB History    Grav Para Term Preterm Abortions TAB SAB Ect Mult Living   4 3 3       3       Past Medical History  Diagnosis Date  . Hypertension   . Depression     PPD after 2010 delivery    Past Surgical History  Procedure Date  . No past surgeries     No family history on file.  History  Substance Use Topics  . Smoking status: Former Smoker -- 0.2 packs/day    Types: Cigarettes    Quit date: 09/29/2012  . Smokeless tobacco: Not on file  . Alcohol Use: Yes     Comment: occassionally    Allergies: No Known Allergies  No prescriptions prior to admission    Review of Systems  Constitutional: Negative for fever and chills.  Cardiovascular: Negative for chest pain.  Gastrointestinal: Negative for nausea, vomiting, abdominal pain, diarrhea and constipation.  Genitourinary: Negative for dysuria.  Neurological: Negative for headaches.   Physical Exam   Last menstrual period 09/05/2012.  Physical Exam  Constitutional: She is oriented to person, place, and time. She appears well-developed and well-nourished. No distress.  Cardiovascular: Normal rate.   Respiratory: Effort normal.  Genitourinary:       Pelvic exam deferred     Musculoskeletal: Normal range of motion.  Neurological: She is alert and oriented to person, place, and time.  Skin: Skin is warm and dry.  Psychiatric: She has a normal mood and affect.    MAU Course  Procedures  MDM [place] ordered for reassurance: Confirmed Missed abortion with retained fetus  Assessment and Plan  A:  Missed Abortion at [redacted]w[redacted]d        Desires D&C  P:  Discussed with Dr Debroah Loop      Message sent to Secretary to schedule D&C within the next few days      Declines second dose of Cytotec  [facility] 11/04/2012, 3:03 PM

## 2012-11-04 NOTE — MAU Note (Signed)
Patient is in with concerns that the cytotec that she placed vaginally on Monday did not work and feels like the first ultrasound may be incorrect. She states that have not and is not having any cramping, pain or vaginal bleeding. She states that she a D & C scheduled on feb. 10th. She states that she still feel all the pregnancy symptoms such as morning nausea.

## 2012-11-05 ENCOUNTER — Encounter (HOSPITAL_COMMUNITY): Payer: Self-pay | Admitting: *Deleted

## 2012-11-06 ENCOUNTER — Encounter (HOSPITAL_COMMUNITY)
Admission: RE | Disposition: A | Discharge status: Home / Self Care | Payer: Self-pay | Source: Ambulatory Visit | Attending: Obstetrics and Gynecology

## 2012-11-06 ENCOUNTER — Ambulatory Visit (HOSPITAL_COMMUNITY)
Admission: RE | Admit: 2012-11-06 | Discharge: 2012-11-06 | Disposition: A | Discharge status: Home / Self Care | Payer: Medicaid Other | Source: Ambulatory Visit | Attending: Obstetrics and Gynecology | Admitting: Obstetrics and Gynecology

## 2012-11-06 ENCOUNTER — Encounter (HOSPITAL_COMMUNITY): Payer: Self-pay | Admitting: *Deleted

## 2012-11-06 ENCOUNTER — Encounter (HOSPITAL_COMMUNITY): Payer: Self-pay | Admitting: Registered Nurse

## 2012-11-06 ENCOUNTER — Ambulatory Visit (HOSPITAL_COMMUNITY): Payer: Medicaid Other | Admitting: Registered Nurse

## 2012-11-06 DIAGNOSIS — O021 Missed abortion: Secondary | ICD-10-CM | POA: Diagnosis present

## 2012-11-06 HISTORY — PX: DILATION AND EVACUATION: SHX1459

## 2012-11-06 SURGERY — DILATION AND EVACUATION, UTERUS
Anesthesia: Monitor Anesthesia Care | Site: Uterus | Wound class: Clean Contaminated

## 2012-11-06 MED ORDER — LACTATED RINGERS IV SOLN
INTRAVENOUS | Status: DC
  Administered 2012-11-06: 12:00:00 via INTRAVENOUS
  Administered 2012-11-06: 50 mL/h via INTRAVENOUS

## 2012-11-06 MED ORDER — DOCUSATE SODIUM 100 MG PO CAPS: 100.0000 mg | ORAL_CAPSULE | Freq: Two times a day (BID) | ORAL | Status: DC

## 2012-11-06 MED ORDER — GLYCOPYRROLATE 0.2 MG/ML IJ SOLN
INTRAMUSCULAR | Status: AC
  Filled 2012-11-06: qty 1

## 2012-11-06 MED ORDER — ONDANSETRON HCL 4 MG/2ML IJ SOLN
INTRAMUSCULAR | Status: DC | PRN
  Administered 2012-11-06: 4 mg via INTRAVENOUS

## 2012-11-06 MED ORDER — PROPOFOL 10 MG/ML IV EMUL
INTRAVENOUS | Status: AC
  Filled 2012-11-06: qty 20

## 2012-11-06 MED ORDER — DOXYCYCLINE HYCLATE 100 MG IV SOLR
200.0000 mg | Freq: Once | INTRAVENOUS | Status: DC
  Filled 2012-11-06: qty 200

## 2012-11-06 MED ORDER — MIDAZOLAM HCL 5 MG/5ML IJ SOLN
INTRAMUSCULAR | Status: DC | PRN
  Administered 2012-11-06: 2 mg via INTRAVENOUS

## 2012-11-06 MED ORDER — ONDANSETRON HCL 4 MG/2ML IJ SOLN
INTRAMUSCULAR | Status: AC
  Filled 2012-11-06: qty 2

## 2012-11-06 MED ORDER — FENTANYL CITRATE 0.05 MG/ML IJ SOLN
INTRAMUSCULAR | Status: AC
  Filled 2012-11-06: qty 5

## 2012-11-06 MED ORDER — MIDAZOLAM HCL 2 MG/2ML IJ SOLN
INTRAMUSCULAR | Status: AC
  Filled 2012-11-06: qty 2

## 2012-11-06 MED ORDER — KETOROLAC TROMETHAMINE 30 MG/ML IJ SOLN
INTRAMUSCULAR | Status: AC
  Filled 2012-11-06: qty 1

## 2012-11-06 MED ORDER — LIDOCAINE HCL (CARDIAC) 20 MG/ML IV SOLN
INTRAVENOUS | Status: AC
  Filled 2012-11-06: qty 5

## 2012-11-06 MED ORDER — IBUPROFEN 600 MG PO TABS: 600.0000 mg | ORAL_TABLET | Freq: Four times a day (QID) | ORAL | Status: DC | PRN

## 2012-11-06 MED ORDER — KETOROLAC TROMETHAMINE 60 MG/2ML IM SOLN
INTRAMUSCULAR | Status: DC | PRN
  Administered 2012-11-06: 30 mg via INTRAMUSCULAR

## 2012-11-06 MED ORDER — DEXAMETHASONE SODIUM PHOSPHATE 10 MG/ML IJ SOLN
INTRAMUSCULAR | Status: DC | PRN
  Administered 2012-11-06: 10 mg via INTRAVENOUS

## 2012-11-06 MED ORDER — CHLOROPROCAINE HCL 1 % IJ SOLN
INTRAMUSCULAR | Status: AC
  Filled 2012-11-06: qty 30

## 2012-11-06 MED ORDER — LIDOCAINE HCL (CARDIAC) 20 MG/ML IV SOLN
INTRAVENOUS | Status: DC | PRN
  Administered 2012-11-06: 70 mg via INTRAVENOUS

## 2012-11-06 MED ORDER — DEXAMETHASONE SODIUM PHOSPHATE 10 MG/ML IJ SOLN
INTRAMUSCULAR | Status: AC
  Filled 2012-11-06: qty 1

## 2012-11-06 MED ORDER — OXYCODONE-ACETAMINOPHEN 5-325 MG PO TABS: 1.0000 | ORAL_TABLET | ORAL | Status: DC | PRN

## 2012-11-06 MED ORDER — DEXTROSE 5 % IV SOLN
200.0000 mg | INTRAVENOUS | Status: DC | PRN
  Administered 2012-11-06: 200 mg via INTRAVENOUS

## 2012-11-06 MED ORDER — FENTANYL CITRATE 0.05 MG/ML IJ SOLN
INTRAMUSCULAR | Status: DC | PRN
  Administered 2012-11-06: 100 ug via INTRAVENOUS
  Administered 2012-11-06 (×3): 50 ug via INTRAVENOUS

## 2012-11-06 MED ORDER — CHLOROPROCAINE HCL 1 % IJ SOLN
INTRAMUSCULAR | Status: DC | PRN
  Administered 2012-11-06: 10 mL

## 2012-11-06 MED ORDER — PROPOFOL 10 MG/ML IV EMUL
INTRAVENOUS | Status: DC | PRN
  Administered 2012-11-06: 20 mg via INTRAVENOUS
  Administered 2012-11-06: 30 mg via INTRAVENOUS
  Administered 2012-11-06: 100 mg via INTRAVENOUS
  Administered 2012-11-06: 30 mg via INTRAVENOUS

## 2012-11-06 MED ORDER — PROPOFOL 10 MG/ML IV EMUL
INTRAVENOUS | Status: AC
  Filled 2012-11-06: qty 40

## 2012-11-06 MED ORDER — PROPOFOL INFUSION 10 MG/ML OPTIME
INTRAVENOUS | Status: DC | PRN
  Administered 2012-11-06: 120 ug/kg/min via INTRAVENOUS

## 2012-11-06 SURGICAL SUPPLY — 20 items
CATH ROBINSON RED A/P 16FR (CATHETERS) ×2 IMPLANT
DECANTER SPIKE VIAL GLASS SM (MISCELLANEOUS) ×2 IMPLANT
GLOVE BIOGEL PI IND STRL 6.5 (GLOVE) ×2 IMPLANT
GLOVE BIOGEL PI INDICATOR 6.5 (GLOVE) ×2
GLOVE SURG SS PI 6.0 STRL IVOR (GLOVE) ×2 IMPLANT
GOWN STRL REIN XL XLG (GOWN DISPOSABLE) ×4 IMPLANT
KIT BERKELEY 1ST TRIMESTER 3/8 (MISCELLANEOUS) ×2 IMPLANT
NEEDLE SPNL 22GX3.5 QUINCKE BK (NEEDLE) ×2 IMPLANT
NS IRRIG 1000ML POUR BTL (IV SOLUTION) ×2 IMPLANT
PACK VAGINAL MINOR WOMEN LF (CUSTOM PROCEDURE TRAY) ×2 IMPLANT
PAD OB MATERNITY 4.3X12.25 (PERSONAL CARE ITEMS) ×2 IMPLANT
PAD PREP 24X48 CUFFED NSTRL (MISCELLANEOUS) ×2 IMPLANT
SET BERKELEY SUCTION TUBING (SUCTIONS) ×2 IMPLANT
SYR CONTROL 10ML LL (SYRINGE) ×2 IMPLANT
TOWEL OR 17X24 6PK STRL BLUE (TOWEL DISPOSABLE) ×4 IMPLANT
VACURETTE 10 RIGID CVD (CANNULA) IMPLANT
VACURETTE 7MM CVD STRL WRAP (CANNULA) IMPLANT
VACURETTE 8 RIGID CVD (CANNULA) IMPLANT
VACURETTE 9 RIGID CVD (CANNULA) IMPLANT
WATER STERILE IRR 1000ML POUR (IV SOLUTION) ×2 IMPLANT

## 2012-11-06 NOTE — Transfer of Care (Signed)
Immediate Anesthesia Transfer of Care Note  Patient: Misty Shaw  Procedure(s) Performed: Procedure(s) (LRB) with comments: DILATATION AND EVACUATION (N/A)  Patient Location: PACU  Anesthesia Type:MAC  Level of Consciousness: awake, alert  and oriented  Airway & Oxygen Therapy: Patient Spontanous Breathing  Post-op Assessment: Report given to PACU RN and Post -op Vital signs reviewed and stable  Post vital signs: Reviewed and stable  Complications: No apparent anesthesia complications

## 2012-11-06 NOTE — Anesthesia Postprocedure Evaluation (Signed)
Anesthesia Post Note  Patient: Misty Shaw  Procedure(s) Performed: Procedure(s) (LRB): DILATATION AND EVACUATION (N/A)  Anesthesia type: MAC  Patient location: PACU  Post pain: Pain level controlled  Post assessment: Post-op Vital signs reviewed  Last Vitals:  Filed Vitals:   11/06/12 1324  BP:   Pulse:   Temp: 36.9 C  Resp:     Post vital signs: Reviewed  Level of consciousness: sedated  Complications: No apparent anesthesia complications

## 2012-11-06 NOTE — OR Nursing (Signed)
Saline locke dc at 13:55 in pacu. Margarita Mail angiocath intact

## 2012-11-06 NOTE — Anesthesia Preprocedure Evaluation (Addendum)
Anesthesia Evaluation  Patient identified by MRN, date of birth, ID band Patient awake    Reviewed: Allergy & Precautions, H&P , NPO status , Patient's Chart, lab work & pertinent test results  Airway Mallampati: I TM Distance: >3 FB Neck ROM: full    Dental No notable dental hx. (+) Teeth Intact   Pulmonary neg pulmonary ROS,    Pulmonary exam normal       Cardiovascular negative cardio ROS      Neuro/Psych negative neurological ROS     GI/Hepatic negative GI ROS, Neg liver ROS,   Endo/Other  negative endocrine ROS  Renal/GU negative Renal ROS  negative genitourinary   Musculoskeletal negative musculoskeletal ROS (+)   Abdominal Normal abdominal exam  (+)   Peds negative pediatric ROS (+)  Hematology negative hematology ROS (+)   Anesthesia Other Findings   Reproductive/Obstetrics negative OB ROS                          Anesthesia Physical Anesthesia Plan  ASA: II  Anesthesia Plan: MAC   Post-op Pain Management:    Induction: Intravenous  Airway Management Planned:   Additional Equipment:   Intra-op Plan:   Post-operative Plan:   Informed Consent: I have reviewed the patients History and Physical, chart, labs and discussed the procedure including the risks, benefits and alternatives for the proposed anesthesia with the patient or authorized representative who has indicated his/her understanding and acceptance.     Plan Discussed with: CRNA and Surgeon  Anesthesia Plan Comments:        Anesthesia Quick Evaluation

## 2012-11-06 NOTE — Op Note (Signed)
Misty Shaw PROCEDURE DATE: 11/06/2012  PREOPERATIVE DIAGNOSIS: 7 week missed abortion. POSTOPERATIVE DIAGNOSIS: The same. PROCEDURE:     Dilation and Evacuation. SURGEON:  Dr. Catalina Antigua  INDICATIONS: 27 y.o. G4P3003with MAB at [redacted] weeks gestation, needing surgical completion.  Risks of surgery were discussed with the patient including but not limited to: bleeding which may require transfusion; infection which may require antibiotics; injury to uterus or surrounding organs;need for additional procedures including laparotomy or laparoscopy; possibility of intrauterine scarring which may impair future fertility; and other postoperative/anesthesia complications. Written informed consent was obtained.    FINDINGS:  A 8 size midline uterus, moderate amounts of products of conception, specimen sent to pathology.  ANESTHESIA:    Monitored intravenous sedation, paracervical block. INTRAVENOUS FLUIDS:  1000 ml of LR ESTIMATED BLOOD LOSS:  Less than 20 ml. SPECIMENS:  Products of conception sent to pathology COMPLICATIONS:  None immediate.  PROCEDURE DETAILS:  The patient received intravenous antibiotics while in the preoperative area.  She was then taken to the operating room where general anesthesia was administered and was found to be adequate.  After an adequate timeout was performed, she was placed in the dorsal lithotomy position and examined; then prepped and draped in the sterile manner.   Her bladder was catheterized for an unmeasured amount of clear, yellow urine. A vaginal speculum was then placed in the patient's vagina and a single tooth tenaculum was applied to the anterior lip of the cervix.  A paracervical block using 0.5% Marcaine was administered. The cervix was gently dilated to accommodate an 8 mm suction curette that was gently advanced to the uterine fundus.  The suction device was then activated and curette slowly rotated to clear the uterus of products of conception.  A  sharp curettage was then performed to confirm complete emptying of the uterus. There was minimal bleeding noted and the tenaculum removed with good hemostasis noted.   All instruments were removed from the patient's vagina. Bedside ultrasound showed a thin endometrial stripe. The patient tolerated the procedure well and was taken to the recovery area awake, and in stable condition.  The patient will be discharged to home as per PACU criteria.  Routine postoperative instructions given.  She was prescribed Percocet, Ibuprofen and Colace.  She will follow up in the clinic in 2 weeks for postoperative evaluation.

## 2012-11-06 NOTE — H&P (Signed)
Misty Shaw is an 27 y.o. female [phone] at [redacted]w[redacted]d by LMP but with 7 week missed abortion presenting today for scheduled dilatation and evacuation. Patient failed medical management with cytotec.  Pertinent Gynecological History: Menses: flow is moderate Bleeding: monthly Contraception: none DES exposure: denies Blood transfusions: none Sexually transmitted diseases: no past history Previous GYN Procedures: n/a  Last mammogram: n/a  Last pap: none in file  OB History: G4, P3003   Menstrual History: Patient's last menstrual period was 09/05/2012.    Past Medical History  Diagnosis Date  . Depression     PPD after 2010 delivery    Past Surgical History  Procedure Date  . No past surgeries     History reviewed. No pertinent family history.  Social History:  reports that she quit smoking about 5 weeks ago. Her smoking use included Cigarettes. She smoked .25 packs per day. She does not have any smokeless tobacco history on file. She reports that she drinks alcohol. She reports that she does not use illicit drugs.  Allergies: No Known Allergies  No prescriptions prior to admission    Review of Systems  All other systems reviewed and are negative.    Blood pressure 108/67, pulse 79, temperature 97.9 F (36.6 C), temperature source Oral, resp. rate 20, height 5\' 4"  (1.626 m), weight 148 lb (67.132 kg), last menstrual period 09/05/2012, SpO2 100.00%. Physical Exam  GENERAL: Well-developed, well-nourished female in no acute distress.  HEENT: Normocephalic, atraumatic. Sclerae anicteric.  NECK: Supple. Normal thyroid.  LUNGS: Clear to auscultation bilaterally.  HEART: Regular rate and rhythm. BREASTS: Symmetric in size. No palpable masses or lymphadenopathy, skin changes, or nipple drainage. ABDOMEN: Soft, nontender, nondistended. No organomegaly. PELVIC: Deferred to OR EXTREMITIES: No cyanosis, clubbing, or edema, 2+ distal pulses.   No results found for this or  any previous visit (from the past 24 hour(s)).    Assessment/Plan: 26 yp [id number] with missed abortion and failed medical management with cytotec here for dilatation and evacuation Risks, benefits and alternatives were explained to the patient including but not limited to risks of bleeding, infection, uterine perforation, damage to adjacent organs. Patient verbalized understanding and all questions were answered. Patient will follow-up at [facility] for post-op appointment  Djeneba Barsch 11/06/2012, 11:04 AM

## 2012-11-09 ENCOUNTER — Encounter (HOSPITAL_COMMUNITY): Payer: Self-pay | Admitting: Obstetrics and Gynecology

## 2012-11-16 ENCOUNTER — Encounter: Payer: Self-pay | Admitting: Obstetrics & Gynecology

## 2012-11-30 ENCOUNTER — Telehealth: Payer: Self-pay

## 2012-11-30 NOTE — Telephone Encounter (Signed)
Pt called and stated that she has a question about a D/C she recently had.  Pt has a post op appt scheduled for 12/02/12. Called pt and pt stated that she has already talked to a nurse and gotten another appt scheduled.

## 2012-12-02 ENCOUNTER — Ambulatory Visit (INDEPENDENT_AMBULATORY_CARE_PROVIDER_SITE_OTHER): Payer: Self-pay | Admitting: Obstetrics and Gynecology

## 2012-12-02 ENCOUNTER — Encounter: Payer: Self-pay | Admitting: Obstetrics and Gynecology

## 2012-12-02 VITALS — BP 113/76 | HR 76 | Temp 97.7°F | Ht 64.0 in | Wt 152.8 lb

## 2012-12-02 DIAGNOSIS — Z09 Encounter for follow-up examination after completed treatment for conditions other than malignant neoplasm: Secondary | ICD-10-CM

## 2012-12-02 NOTE — Progress Notes (Signed)
  Subjective:    Patient ID: Misty Shaw, female    DOB: 11-18-1985, 27 y.o.   MRN: [id number]  HPI 27 yo [id number] s/p D&E on 11/06/2012 secondary to missed abortion presenting today for post-op check. Patient has been doing well since the procedure and denies any abdominal pain, fevers or chills.  Past Medical History  Diagnosis Date  . Depression     PPD after 2010 delivery   Past Surgical History  Procedure Laterality Date  . No past surgeries    . Dilation and evacuation  11/06/2012    Procedure: DILATATION AND EVACUATION;  Surgeon: Catalina Antigua, MD;  Location: [facility];  Service: Gynecology;  Laterality: N/A;   No family history on file.  History  Substance Use Topics  . Smoking status: Current Every Day Smoker -- 0.25 packs/day for 4 years    Types: Cigarettes    Last Attempt to Quit: 09/29/2012  . Smokeless tobacco: Not on file  . Alcohol Use: Yes     Comment: occasionally     Medication List       These changes are accurate as of: 12/02/2012  3:11 PM. If you have any questions, ask your nurse or doctor.          STOP taking these medications       docusate sodium 100 MG capsule  Commonly known as:  COLACE  Stopped by:  Catalina Antigua, MD     ibuprofen 600 MG tablet  Commonly known as:  ADVIL,MOTRIN  Stopped by:  Catalina Antigua, MD     oxyCODONE-acetaminophen 5-325 MG per tablet  Commonly known as:  PERCOCET/ROXICET  Stopped by:  Catalina Antigua, MD        Review of Systems  All other systems reviewed and are negative.      Objective:   Physical Exam GENERAL: Well-developed, well-nourished female in no acute distress.  LUNGS: Clear to auscultation bilaterally.  HEART: Regular rate and rhythm. ABDOMEN: Soft, nontender, nondistended. No organomegaly. PELVIC: Normal external female genitalia. Vagina is pink and rugated.  Normal discharge. Normal appearing cervix. Uterus is normal in size. No adnexal mass or tenderness. EXTREMITIES: No cyanosis,  clubbing, or edema, 2+ distal pulses.     Assessment & Plan:  27 yo [phone] s/p D&E secondary to missed ab here for a post op check - Patient medically cleared to resume all activities of daily living - This was not a planned pregnancy and patient is interested in birth control - Patient is in a hurry today but print-out of birth control options were provided - Patient will RTC when ready for birth control initiation

## 2012-12-02 NOTE — Patient Instructions (Signed)
Contraception Choices  Birth control (contraception) can stop pregnancy from happening. Different types of birth control work in different ways. Some can:  · Make the mucus in the cervix thick. This makes it hard for sperm to get into the uterus.  · Thin the lining of the uterus. This makes it hard for an egg to attach to the wall of the uterus.  · Stop the ovaries from releasing an egg.  · Block the sperm from reaching the egg.  Certain types of surgery can stop pregnancy from happening. For women, the sugery closes the fallopian tubes (tubal ligation). For men, the surgery stops sperm from releasing during sex (vasectomy).  HORMONAL BIRTH CONTROL  Hormonal birth control stops pregnancy by putting hormones into your body. Types of birth control include:  · A small tube put under the skin of the upper arm (implant). The tube can stay in place for 3 years.  · Shots given every 3 months.  · Pills taken every day or once after sex (intercourse).  · Patches that are changed once a week.  · A ring put into the vagina (vaginal ring). The ring is left in place for 3 weeks and removed for 1 week. Then, a new ring is put in the vagina.  BARRIER BIRTH CONTROL   Barrier birth control blocks sperm from reaching the egg. Types of birth control include:   · A thin covering worn on the penis (female condom) during sex.  · A soft, loose covering put into the vagina (female condom) before sex.  · A rubber bowl that sits over the cervix (diaphragm). The bowl must be made for you. The bowl is put into the vagina before sex. The bowl is left in place for 6 to 8 hours after sex.  · A small, soft cup that fits over the cervix (cervical cap). The cup must be made for you. The cup can be left in place for 48 hours after sex.  · A sponge that is put into the vagina before sex.  · A chemical that kills or blocks sperm from getting into the cervix and uterus (spermicide). The chemical may be a cream, jelly, foam, or pill.  INTRAUTERINE (IUD)  BIRTH CONTROL   IUD birth control is a small, T-shaped piece of plastic. The plastic is put inside the uterus. There are 2 types of IUD:  · Copper IUD. The IUD is covered in copper wire. The copper makes a fluid that kills sperm. It can stay in place for 10 years.  · Hormone IUD. The hormone stops pregnancy from happening. It can stay in place for 5 years.  NATURAL FAMILY PLANNING BIRTH CONTROL   Natural family planning means not having sex or using barrier birth control when the woman is fertile. A woman can:  · Use a calendar to keep track of when she is fertile.  · Use a thermometer to measure her body temperature.  Protect yourself against sexual diseases no matter what type of birth control you use. Talk to your doctor about which type of birth control is best for you.  Document Released: 07/21/2009 Document Revised: 12/16/2011 Document Reviewed: 01/30/2011  [employer]® Patient Information ©2013 [employer].

## 2012-12-21 ENCOUNTER — Emergency Department (HOSPITAL_COMMUNITY)
Admission: EM | Admit: 2012-12-21 | Discharge: 2012-12-21 | Discharge status: Against Medical Advice | Payer: Self-pay | Attending: Emergency Medicine | Admitting: Emergency Medicine

## 2012-12-21 ENCOUNTER — Inpatient Hospital Stay (HOSPITAL_COMMUNITY)
Admission: AD | Admit: 2012-12-21 | Discharge: 2012-12-21 | Disposition: A | Discharge status: Home / Self Care | Payer: Self-pay | Attending: Obstetrics & Gynecology | Admitting: Obstetrics & Gynecology

## 2012-12-21 ENCOUNTER — Encounter: Payer: Self-pay | Admitting: Obstetrics and Gynecology

## 2012-12-21 ENCOUNTER — Encounter (HOSPITAL_COMMUNITY): Payer: Self-pay

## 2012-12-21 DIAGNOSIS — Z3202 Encounter for pregnancy test, result negative: Secondary | ICD-10-CM | POA: Insufficient documentation

## 2012-12-21 DIAGNOSIS — F17200 Nicotine dependence, unspecified, uncomplicated: Secondary | ICD-10-CM | POA: Insufficient documentation

## 2012-12-21 DIAGNOSIS — R109 Unspecified abdominal pain: Secondary | ICD-10-CM | POA: Insufficient documentation

## 2012-12-21 LAB — URINALYSIS, ROUTINE W REFLEX MICROSCOPIC
Glucose, UA: NEGATIVE mg/dL
Ketones, ur: NEGATIVE mg/dL
Leukocytes, UA: NEGATIVE
Nitrite: NEGATIVE
Protein, ur: NEGATIVE mg/dL
pH: 6 (ref 5.0–8.0)

## 2012-12-21 LAB — POCT PREGNANCY, URINE: Preg Test, Ur: NEGATIVE

## 2012-12-21 NOTE — ED Notes (Addendum)
Patient c/o lower abdominal pain and low back pain. Patient also has urinary frequency and nausea, but denies vaginal discharge or vomiting, or diarrhea. Patient had a D&C on 11/06/12 and states she is still having positive pregnancy tests.

## 2012-12-21 NOTE — ED Notes (Signed)
Pt states that she had a d&c done in Jan and has had intermit abd pain cramping with a positive preg test. [facility] but did not want to wait the 4 hours to be seen so she came here.

## 2012-12-21 NOTE — ED Notes (Signed)
Pt refused to wait and did not want to wait and see and md to be screened.

## 2012-12-22 ENCOUNTER — Encounter (HOSPITAL_COMMUNITY): Payer: Self-pay | Admitting: *Deleted

## 2012-12-22 ENCOUNTER — Emergency Department (HOSPITAL_COMMUNITY)
Admission: EM | Admit: 2012-12-22 | Discharge: 2012-12-22 | Disposition: A | Discharge status: Home / Self Care | Payer: Medicaid Other | Attending: Emergency Medicine | Admitting: Emergency Medicine

## 2012-12-22 DIAGNOSIS — Z8659 Personal history of other mental and behavioral disorders: Secondary | ICD-10-CM | POA: Insufficient documentation

## 2012-12-22 DIAGNOSIS — Z3202 Encounter for pregnancy test, result negative: Secondary | ICD-10-CM | POA: Insufficient documentation

## 2012-12-22 DIAGNOSIS — N898 Other specified noninflammatory disorders of vagina: Secondary | ICD-10-CM | POA: Insufficient documentation

## 2012-12-22 DIAGNOSIS — F17200 Nicotine dependence, unspecified, uncomplicated: Secondary | ICD-10-CM | POA: Insufficient documentation

## 2012-12-22 DIAGNOSIS — R102 Pelvic and perineal pain: Secondary | ICD-10-CM

## 2012-12-22 DIAGNOSIS — R112 Nausea with vomiting, unspecified: Secondary | ICD-10-CM | POA: Insufficient documentation

## 2012-12-22 DIAGNOSIS — N949 Unspecified condition associated with female genital organs and menstrual cycle: Secondary | ICD-10-CM | POA: Insufficient documentation

## 2012-12-22 DIAGNOSIS — R109 Unspecified abdominal pain: Secondary | ICD-10-CM | POA: Insufficient documentation

## 2012-12-22 LAB — POCT I-STAT, CHEM 8
BUN: 7 mg/dL (ref 6–23)
Chloride: 108 mEq/L (ref 96–112)
Potassium: 3.7 mEq/L (ref 3.5–5.1)
Sodium: 143 mEq/L (ref 135–145)
TCO2: 25 mmol/L (ref 0–100)

## 2012-12-22 LAB — URINALYSIS, ROUTINE W REFLEX MICROSCOPIC
Bilirubin Urine: NEGATIVE
Hgb urine dipstick: NEGATIVE
Nitrite: NEGATIVE
Protein, ur: NEGATIVE mg/dL
Urobilinogen, UA: 1 mg/dL (ref 0.0–1.0)

## 2012-12-22 LAB — CBC
HCT: 37.4 % (ref 36.0–46.0)
Hemoglobin: 12.5 g/dL (ref 12.0–15.0)
MCHC: 33.4 g/dL (ref 30.0–36.0)
RBC: 4.09 MIL/uL (ref 3.87–5.11)
WBC: 4.7 10*3/uL (ref 4.0–10.5)

## 2012-12-22 LAB — HCG, QUANTITATIVE, PREGNANCY: hCG, Beta Chain, Quant, S: 1 m[IU]/mL (ref <5)

## 2012-12-22 LAB — POCT PREGNANCY, URINE: Preg Test, Ur: NEGATIVE

## 2012-12-22 LAB — WET PREP, GENITAL

## 2012-12-22 NOTE — ED Notes (Signed)
Pt c/o lower back and abd cramping x's 1 week. Reports having D and C January 31st. Also reports "faintly positive" home pregnancy test. Pt denies vaginal bleeding. Reports LMP March 1st.

## 2012-12-22 NOTE — ED Provider Notes (Signed)
History     CSN: [id number]  Arrival date & time 12/22/12  1058   First MD Initiated Contact with Patient 12/22/12 1102      Chief Complaint  Patient presents with  . Abdominal Cramping  . Emesis    (Consider location/radiation/quality/duration/timing/severity/associated sxs/prior treatment) Patient is a 27 y.o. female presenting with cramps and vomiting.  Abdominal Cramping Associated symptoms include nausea. Pertinent negatives include no abdominal pain, chills, fever or vomiting.  Emesis Associated symptoms: no abdominal pain, no chills and no diarrhea    Misty Shaw is a 27 y.o. female who presents to ED with complaint of lower abdominal cramping, spotting. States symptoms began a week ago. States had D&C 2 months ago for miscarriage. States no problems since. Last menstrual cycle 18 days ago. Denies vaginal discharge. No urinary symptoms. No fever. Admits to mild nausea, no vomiting, no changes in bowels. States has been taking pregnancy tests at home and they have been positive since.   Past Medical History  Diagnosis Date  . Depression     PPD after 2010 delivery    Past Surgical History  Procedure Laterality Date  . No past surgeries    . Dilation and evacuation  11/06/2012    Procedure: DILATATION AND EVACUATION;  Surgeon: Catalina Antigua, MD;  Location: [facility];  Service: Gynecology;  Laterality: N/A;    Family History  Problem Relation Age of Onset  . Hypertension Mother     History  Substance Use Topics  . Smoking status: Current Every Day Smoker -- 0.25 packs/day for 4 years    Types: Cigarettes  . Smokeless tobacco: Never Used  . Alcohol Use: Yes     Comment: occasionally    OB History   Grav Para Term Preterm Abortions TAB SAB Ect Mult Living   4 3 3       3       Review of Systems  Constitutional: Negative for fever and chills.  Respiratory: Negative.   Cardiovascular: Negative.   Gastrointestinal: Positive for nausea. Negative for  vomiting, abdominal pain, diarrhea, constipation and blood in stool.  Genitourinary: Positive for vaginal bleeding and pelvic pain. Negative for dysuria, hematuria, decreased urine volume, vaginal discharge and vaginal pain.  Musculoskeletal: Negative.     Allergies  Review of patient's allergies indicates no known allergies.  Home Medications  No current outpatient prescriptions on file.  BP 119/59  Pulse 71  Temp(Src) 97.7 F (36.5 C) (Oral)  Resp 18  SpO2 100%  LMP 12/05/2012  Physical Exam  Constitutional: She appears well-developed and well-nourished. No distress.  HENT:  Head: Normocephalic.  Eyes: Conjunctivae are normal.  Neck: Neck supple.  Cardiovascular: Normal rate, regular rhythm and normal heart sounds.   Pulmonary/Chest: Effort normal and breath sounds normal. No respiratory distress. She has no wheezes. She has no rales.  Abdominal: Soft. Bowel sounds are normal. She exhibits no distension. There is no tenderness. There is no rebound.  Genitourinary:  Normal external genitalia. Bloody discharge. No CMT. Cervix closed. No uterine or adnexal tenderness  Neurological: She is alert.  Skin: Skin is warm and dry.    ED Course  Procedures (including critical care time)  Results for orders placed during the hospital encounter of 12/22/12  WET PREP, GENITAL      Result Value Range   Yeast Wet Prep HPF POC NONE SEEN  NONE SEEN   Trich, Wet Prep NONE SEEN  NONE SEEN   Clue Cells Wet Prep HPF POC  FEW (*) NONE SEEN   WBC, Wet Prep HPF POC MODERATE (*) NONE SEEN  URINALYSIS, ROUTINE W REFLEX MICROSCOPIC      Result Value Range   Color, Urine YELLOW  YELLOW   APPearance CLEAR  CLEAR   Specific Gravity, Urine 1.030  1.005 - 1.030   pH 6.0  5.0 - 8.0   Glucose, UA NEGATIVE  NEGATIVE mg/dL   Hgb urine dipstick NEGATIVE  NEGATIVE   Bilirubin Urine NEGATIVE  NEGATIVE   Ketones, ur NEGATIVE  NEGATIVE mg/dL   Protein, ur NEGATIVE  NEGATIVE mg/dL   Urobilinogen, UA 1.0   0.0 - 1.0 mg/dL   Nitrite NEGATIVE  NEGATIVE   Leukocytes, UA NEGATIVE  NEGATIVE  CBC      Result Value Range   WBC 4.7  4.0 - 10.5 K/uL   RBC 4.09  3.87 - 5.11 MIL/uL   Hemoglobin 12.5  12.0 - 15.0 g/dL   HCT 96.2  95.2 - 84.1 %   MCV 91.4  78.0 - 100.0 fL   MCH 30.6  26.0 - 34.0 pg   MCHC 33.4  30.0 - 36.0 g/dL   RDW 32.4  40.1 - 02.7 %   Platelets 230  150 - 400 K/uL  HCG, QUANTITATIVE, PREGNANCY      Result Value Range   hCG, Beta Chain, Quant, S 1  <5 mIU/mL  POCT PREGNANCY, URINE      Result Value Range   Preg Test, Ur NEGATIVE  NEGATIVE  POCT I-STAT, CHEM 8      Result Value Range   Sodium 143  135 - 145 mEq/L   Potassium 3.7  3.5 - 5.1 mEq/L   Chloride 108  96 - 112 mEq/L   BUN 7  6 - 23 mg/dL   Creatinine, Ser 2.53  0.50 - 1.10 mg/dL   Glucose, Bld 85  70 - 99 mg/dL   Calcium, Ion 6.64  4.03 - 1.23 mmol/L   TCO2 25  0 - 100 mmol/L   Hemoglobin 12.9  12.0 - 15.0 g/dL   HCT 47.4  25.9 - 56.3 %   1:39 PM PT with negative pregnancy tests here. Exam with no guarding, no CMT, no uterine tenderness. GC chlamydia sent. Do not think further imaging necessary at this time.    1. Abdominal cramping   2. Pelvic pain       MDM  Pt with D&E on Jan 31st, no problems since then. Vaginal spotting and pelvic cramping, pregnancy test positive at home. Here urine preg and HCG are negative. Her exam unremarkable with no significant tenderness. Doubt torsion or PID. Cultures sent. Doubt retained products given it has been 2 months since D&E. Suspect irregular menses since her recent pregancy. Will try motrin/tylenol for pain. Follow up with GYN as needed. Pt agrees with the plan.   Filed Vitals:   12/22/12 1106  BP: 119/59  Pulse: 71  Temp: 97.7 F (36.5 C)  Resp: 18             Jazae Gandolfi A Ashon Rosenberg, PA-C 12/22/12 1342

## 2012-12-22 NOTE — ED Notes (Signed)
PA advised pelvic exam was set up and ready.

## 2012-12-23 LAB — GC/CHLAMYDIA PROBE AMP
CT Probe RNA: NEGATIVE
GC Probe RNA: NEGATIVE

## 2012-12-23 NOTE — ED Provider Notes (Signed)
Medical screening examination/treatment/procedure(s) were performed by non-physician practitioner and as supervising physician I was immediately available for consultation/collaboration.  Donnetta Hutching, MD 12/23/12 1318

## 2013-03-25 ENCOUNTER — Emergency Department (HOSPITAL_COMMUNITY): Payer: Medicaid Other

## 2013-03-25 ENCOUNTER — Encounter (HOSPITAL_COMMUNITY): Payer: Self-pay | Admitting: Emergency Medicine

## 2013-03-25 ENCOUNTER — Emergency Department (HOSPITAL_COMMUNITY)
Admission: EM | Admit: 2013-03-25 | Discharge: 2013-03-25 | Disposition: A | Discharge status: Home / Self Care | Payer: Medicaid Other | Attending: Emergency Medicine | Admitting: Emergency Medicine

## 2013-03-25 DIAGNOSIS — F17200 Nicotine dependence, unspecified, uncomplicated: Secondary | ICD-10-CM | POA: Insufficient documentation

## 2013-03-25 DIAGNOSIS — R110 Nausea: Secondary | ICD-10-CM | POA: Insufficient documentation

## 2013-03-25 DIAGNOSIS — Z3202 Encounter for pregnancy test, result negative: Secondary | ICD-10-CM | POA: Insufficient documentation

## 2013-03-25 DIAGNOSIS — F3289 Other specified depressive episodes: Secondary | ICD-10-CM | POA: Insufficient documentation

## 2013-03-25 DIAGNOSIS — F329 Major depressive disorder, single episode, unspecified: Secondary | ICD-10-CM | POA: Insufficient documentation

## 2013-03-25 DIAGNOSIS — N83209 Unspecified ovarian cyst, unspecified side: Secondary | ICD-10-CM | POA: Insufficient documentation

## 2013-03-25 LAB — URINALYSIS, ROUTINE W REFLEX MICROSCOPIC
Glucose, UA: NEGATIVE mg/dL
Leukocytes, UA: NEGATIVE
Protein, ur: NEGATIVE mg/dL
pH: 7 (ref 5.0–8.0)

## 2013-03-25 LAB — BASIC METABOLIC PANEL
BUN: 14 mg/dL (ref 6–23)
CO2: 29 mEq/L (ref 19–32)
Calcium: 9.6 mg/dL (ref 8.4–10.5)
Creatinine, Ser: 0.73 mg/dL (ref 0.50–1.10)
Glucose, Bld: 103 mg/dL — ABNORMAL HIGH (ref 70–99)

## 2013-03-25 LAB — WET PREP, GENITAL: Yeast Wet Prep HPF POC: NONE SEEN

## 2013-03-25 LAB — CBC WITH DIFFERENTIAL/PLATELET
Basophils Absolute: 0 10*3/uL (ref 0.0–0.1)
Eosinophils Relative: 2 % (ref 0–5)
HCT: 37.1 % (ref 36.0–46.0)
Lymphocytes Relative: 51 % — ABNORMAL HIGH (ref 12–46)
Lymphs Abs: 2.3 10*3/uL (ref 0.7–4.0)
MCV: 89.6 fL (ref 78.0–100.0)
Monocytes Absolute: 0.2 10*3/uL (ref 0.1–1.0)
Monocytes Relative: 5 % (ref 3–12)
RDW: 12.2 % (ref 11.5–15.5)
WBC: 4.4 10*3/uL (ref 4.0–10.5)

## 2013-03-25 MED ORDER — TRAMADOL HCL 50 MG PO TABS: 50.0000 mg | ORAL_TABLET | Freq: Four times a day (QID) | ORAL | Status: DC | PRN

## 2013-03-25 MED ORDER — ONDANSETRON HCL 4 MG PO TABS: 4.0000 mg | ORAL_TABLET | Freq: Four times a day (QID) | ORAL | Status: DC

## 2013-03-25 MED ORDER — ACETAMINOPHEN 325 MG PO TABS
650.0000 mg | ORAL_TABLET | Freq: Once | ORAL | Status: AC
  Administered 2013-03-25: 650 mg via ORAL
  Filled 2013-03-25: qty 2

## 2013-03-25 NOTE — ED Provider Notes (Signed)
Medical screening examination/treatment/procedure(s) were performed by non-physician practitioner and as supervising physician I was immediately available for consultation/collaboration.    Tashiana Lamarca R Allee Busk, MD 03/25/13 1621 

## 2013-03-25 NOTE — ED Provider Notes (Signed)
History     CSN: [id number]  Arrival date & time 03/25/13  1319   First MD Initiated Contact with Patient 03/25/13 1324      Chief Complaint  Patient presents with  . Abdominal Pain    (Consider location/radiation/quality/duration/timing/severity/associated sxs/prior treatment) HPI  Misty Shaw is a 27 y.o.female presenting to the ER with complaints of suprapubic pain that started 3 days ago. She last finished her period which was slightly irregular 2 weeks ago. No vaginal discharge or bleeding, no dysuria, hematuria, urinary urgency or frequency. She has felt nauseous but has not vomited or had diarrhea. She has not tried taking anything for the pain.   Past Medical History  Diagnosis Date  . Depression     PPD after 2010 delivery    Past Surgical History  Procedure Laterality Date  . No past surgeries    . Dilation and evacuation  11/06/2012    Procedure: DILATATION AND EVACUATION;  Surgeon: Catalina Antigua, MD;  Location: [facility];  Service: Gynecology;  Laterality: N/A;    Family History  Problem Relation Age of Onset  . Hypertension Mother     History  Substance Use Topics  . Smoking status: Current Every Day Smoker -- 0.25 packs/day for 4 years    Types: Cigarettes  . Smokeless tobacco: Never Used  . Alcohol Use: Yes     Comment: occasionally    OB History   Grav Para Term Preterm Abortions TAB SAB Ect Mult Living   4 3 3       3       Review of Systems  Gastrointestinal: Positive for nausea. Negative for vomiting and diarrhea.  Genitourinary: Positive for pelvic pain.  All other systems reviewed and are negative.    Allergies  Review of patient's allergies indicates no known allergies.  Home Medications   Current Outpatient Rx  Name  Route  Sig  Dispense  Refill  . ondansetron (ZOFRAN) 4 MG tablet   Oral   Take 1 tablet (4 mg total) by mouth every 6 (six) hours.   12 tablet   0   . traMADol (ULTRAM) 50 MG tablet   Oral   Take 1  tablet (50 mg total) by mouth every 6 (six) hours as needed for pain.   15 tablet   0     BP 116/67  Pulse 71  Temp(Src) 97.8 F (36.6 C) (Oral)  Resp 18  SpO2 100%  LMP 09/05/2012  Physical Exam  Nursing note and vitals reviewed. Constitutional: She appears well-developed and well-nourished. No distress.  HENT:  Head: Normocephalic and atraumatic.  Eyes: Pupils are equal, round, and reactive to light.  Neck: Normal range of motion. Neck supple.  Cardiovascular: Normal rate and regular rhythm.   Pulmonary/Chest: Effort normal.  Abdominal: Soft.  Genitourinary: Uterus is not deviated and not tender. Cervix exhibits no motion tenderness, no discharge and no friability. Right adnexum displays tenderness. Right adnexum displays no mass and no fullness. Left adnexum displays tenderness. Left adnexum displays no mass and no fullness. There is tenderness around the vagina.  Neurological: She is alert.  Skin: Skin is warm and dry.    ED Course  Procedures (including critical care time)  Labs Reviewed  WET PREP, GENITAL - Abnormal; Notable for the following:    Clue Cells Wet Prep HPF POC RARE (*)    WBC, Wet Prep HPF POC RARE (*)    All other components within normal limits  CBC WITH DIFFERENTIAL -  Abnormal; Notable for the following:    Neutrophils Relative % 42 (*)    Lymphocytes Relative 51 (*)    All other components within normal limits  BASIC METABOLIC PANEL - Abnormal; Notable for the following:    Glucose, Bld 103 (*)    All other components within normal limits  GC/CHLAMYDIA PROBE AMP  URINALYSIS, ROUTINE W REFLEX MICROSCOPIC  POCT PREGNANCY, URINE   US Transvaginal Non-ob  03/25/2013   *RADIOLOGY REPORT*  Clinical Data:  Right adnexal tenderness, evaluate for torsion  TRANSABDOMINAL AND TRANSVAGINAL ULTRASOUND OF PELVIS DOPPLER ULTRASOUND OF OVARIES  Technique:  Both transabdominal and transvaginal ultrasound examinations of the pelvis were performed. Transabdominal  technique was performed for global imaging of the pelvis including uterus, ovaries, adnexal regions, and pelvic cul-de-sac.  It was necessary to proceed with endovaginal exam following the transabdominal exam to visualize the endometrium and right ovary.  Color and duplex Doppler ultrasound was utilized to evaluate blood flow to the ovaries.  Comparison:  None.  FINDINGS  Uterus:  Normal in size and appearance, measuring 6.6 x 4.3 x 6.0 cm.  Endometrium:  Normal in size and appearance, measuring 13 mm.  Right ovary: Measures 4.0 x 2.6 x 2.2 cm and is notable for a 1.7 x 1.6 x 1.7 cm hyperechoic lesion, likely reflecting a dermoid. Additional 1.7 x 1.5 x 1.4 cm involuting corpus luteal cyst.  Left ovary: Normal appearance/no adnexal mass, measuring 2.8 x 1.8 x 2.6 cm.  Pulsed Doppler evaluation demonstrates normal low-resistance arterial and venous waveforms in both ovaries.  IMPRESSION:  1.7 cm hyperechoic right ovarian lesion, likely reflecting a dermoid.  1.7 cm involuting corpus luteal cyst on the right.  No sonographic evidence for ovarian torsion.   Original Report Authenticated By: Charline Bills, M.D.   US Pelvis Complete  03/25/2013   *RADIOLOGY REPORT*  Clinical Data:  Right adnexal tenderness, evaluate for torsion  TRANSABDOMINAL AND TRANSVAGINAL ULTRASOUND OF PELVIS DOPPLER ULTRASOUND OF OVARIES  Technique:  Both transabdominal and transvaginal ultrasound examinations of the pelvis were performed. Transabdominal technique was performed for global imaging of the pelvis including uterus, ovaries, adnexal regions, and pelvic cul-de-sac.  It was necessary to proceed with endovaginal exam following the transabdominal exam to visualize the endometrium and right ovary.  Color and duplex Doppler ultrasound was utilized to evaluate blood flow to the ovaries.  Comparison:  None.  FINDINGS  Uterus:  Normal in size and appearance, measuring 6.6 x 4.3 x 6.0 cm.  Endometrium:  Normal in size and appearance,  measuring 13 mm.  Right ovary: Measures 4.0 x 2.6 x 2.2 cm and is notable for a 1.7 x 1.6 x 1.7 cm hyperechoic lesion, likely reflecting a dermoid. Additional 1.7 x 1.5 x 1.4 cm involuting corpus luteal cyst.  Left ovary: Normal appearance/no adnexal mass, measuring 2.8 x 1.8 x 2.6 cm.  Pulsed Doppler evaluation demonstrates normal low-resistance arterial and venous waveforms in both ovaries.  IMPRESSION:  1.7 cm hyperechoic right ovarian lesion, likely reflecting a dermoid.  1.7 cm involuting corpus luteal cyst on the right.  No sonographic evidence for ovarian torsion.   Original Report Authenticated By: Charline Bills, M.D.   [place] Art/ven Flow Abd Pelv Doppler  03/25/2013   *RADIOLOGY REPORT*  Clinical Data:  Right adnexal tenderness, evaluate for torsion  TRANSABDOMINAL AND TRANSVAGINAL ULTRASOUND OF PELVIS DOPPLER ULTRASOUND OF OVARIES  Technique:  Both transabdominal and transvaginal ultrasound examinations of the pelvis were performed. Transabdominal technique was performed for global imaging of  the pelvis including uterus, ovaries, adnexal regions, and pelvic cul-de-sac.  It was necessary to proceed with endovaginal exam following the transabdominal exam to visualize the endometrium and right ovary.  Color and duplex Doppler ultrasound was utilized to evaluate blood flow to the ovaries.  Comparison:  None.  FINDINGS  Uterus:  Normal in size and appearance, measuring 6.6 x 4.3 x 6.0 cm.  Endometrium:  Normal in size and appearance, measuring 13 mm.  Right ovary: Measures 4.0 x 2.6 x 2.2 cm and is notable for a 1.7 x 1.6 x 1.7 cm hyperechoic lesion, likely reflecting a dermoid. Additional 1.7 x 1.5 x 1.4 cm involuting corpus luteal cyst.  Left ovary: Normal appearance/no adnexal mass, measuring 2.8 x 1.8 x 2.6 cm.  Pulsed Doppler evaluation demonstrates normal low-resistance arterial and venous waveforms in both ovaries.  IMPRESSION:  1.7 cm hyperechoic right ovarian lesion, likely reflecting a dermoid.   1.7 cm involuting corpus luteal cyst on the right.  No sonographic evidence for ovarian torsion.   Original Report Authenticated By: Charline Bills, M.D.     1. Ovarian cyst       MDM  Patients labs are unremarkable. US pelvic ordered to r/o intrapelvic pathology. She has ovarian cyst and dermoid cyst. Question cyst rupture as cause of her pain.   Will give pain medication Rx and referral to [facility] clinic for further treatment.  27 y.o.Misty Shaw's evaluation in the Emergency Department is complete. It has been determined that no acute conditions requiring further emergency intervention are present at this time. The patient/guardian have been advised of the diagnosis and plan. We have discussed signs and symptoms that warrant return to the ED, such as changes or worsening in symptoms.  Vital signs are stable at discharge. Filed Vitals:   03/25/13 1332  BP: 116/67  Pulse: 71  Temp: 97.8 F (36.6 C)  Resp: 18    Patient/guardian has voiced understanding and agreed to follow-up with the PCP or specialist.         Dorthula Matas, PA-C 03/25/13 1615

## 2013-03-25 NOTE — ED Notes (Addendum)
Pelvic cart set up. Tiffany PA made aware.

## 2013-03-25 NOTE — ED Notes (Signed)
Pt states that she has lower abd pain her lmp was 2 weeks ago and was not normal. Denies any frequency.

## 2013-04-13 ENCOUNTER — Encounter (HOSPITAL_COMMUNITY): Payer: Self-pay | Admitting: *Deleted

## 2013-04-13 ENCOUNTER — Inpatient Hospital Stay (HOSPITAL_COMMUNITY)
Admission: AD | Admit: 2013-04-13 | Discharge: 2013-04-14 | Disposition: A | Discharge status: Home / Self Care | Payer: Medicaid Other | Source: Ambulatory Visit | Attending: Obstetrics | Admitting: Obstetrics

## 2013-04-13 DIAGNOSIS — D279 Benign neoplasm of unspecified ovary: Secondary | ICD-10-CM | POA: Insufficient documentation

## 2013-04-13 DIAGNOSIS — R109 Unspecified abdominal pain: Secondary | ICD-10-CM | POA: Insufficient documentation

## 2013-04-13 DIAGNOSIS — N949 Unspecified condition associated with female genital organs and menstrual cycle: Secondary | ICD-10-CM | POA: Insufficient documentation

## 2013-04-13 DIAGNOSIS — O99891 Other specified diseases and conditions complicating pregnancy: Secondary | ICD-10-CM | POA: Insufficient documentation

## 2013-04-13 DIAGNOSIS — O34599 Maternal care for other abnormalities of gravid uterus, unspecified trimester: Secondary | ICD-10-CM | POA: Insufficient documentation

## 2013-04-13 NOTE — MAU Note (Signed)
SAYS SHE WENT  TO [facility]   3 WEEKS AGO- FOR CRAMPING- DID UPT- NEG AND DID U/S-  CYST.   THEN  1-2 WEEKS AFTER THAT - SHE TOOK HPT- POST-  WENT TO HD TO CONFIRM   AND IT WAS POSTIVE. PT  SAYS  SHE WAS IN OFFICE  TODAY-  DR MARSHALL-   PAP SMEAR,  VE , BLOOD WORK.  SHE WAS HAVING CRAMPS-- TOLD HIM OF PRESSURE AND CRAMPS-.  SHE HAS U/S  [facility] ON Friday  IF APPROVED.   NO VAG BLEEDING.  LAST SEX-    1-2 WEEKS AGO.

## 2013-04-14 ENCOUNTER — Encounter (HOSPITAL_COMMUNITY): Payer: Self-pay | Admitting: *Deleted

## 2013-04-14 ENCOUNTER — Inpatient Hospital Stay (HOSPITAL_COMMUNITY): Payer: Medicaid Other

## 2013-04-14 DIAGNOSIS — O9989 Other specified diseases and conditions complicating pregnancy, childbirth and the puerperium: Secondary | ICD-10-CM

## 2013-04-14 LAB — URINALYSIS, ROUTINE W REFLEX MICROSCOPIC
Bilirubin Urine: NEGATIVE
Glucose, UA: NEGATIVE mg/dL
Hgb urine dipstick: NEGATIVE
Ketones, ur: NEGATIVE mg/dL
Protein, ur: NEGATIVE mg/dL

## 2013-04-14 LAB — CBC
HCT: 33.5 % — ABNORMAL LOW (ref 36.0–46.0)
Hemoglobin: 11.5 g/dL — ABNORMAL LOW (ref 12.0–15.0)
MCH: 30.2 pg (ref 26.0–34.0)
MCHC: 34.3 g/dL (ref 30.0–36.0)
RDW: 12.5 % (ref 11.5–15.5)

## 2013-04-14 LAB — POCT PREGNANCY, URINE: Preg Test, Ur: POSITIVE — AB

## 2013-04-14 LAB — HCG, QUANTITATIVE, PREGNANCY: hCG, Beta Chain, Quant, S: 4670 m[IU]/mL — ABNORMAL HIGH (ref <5)

## 2013-04-14 NOTE — MAU Note (Signed)
Pt states she has had abdominal pressure and cramping for about 2 dAYS

## 2013-04-14 NOTE — MAU Provider Note (Signed)
Chief Complaint: Abdominal Pain   First Provider Initiated Contact with Patient 04/14/13 0243     SUBJECTIVE HPI: Misty Shaw is a 27 y.o. G5P3013 at [redacted]w[redacted]d by LMP who presents with pelvic pressure x 2 days. More than previous pregnancies. Denies VB, vaginal dischargte. Seen at Dr, Elsie Stain office yesterday. Pelvic, GC/CT, blood work done.  Told him about pressure. States she will have [place] ordered after approved. Pressure has worsened. Has SAB 10/2012. Worries that she is miscarrying again. Pos UPT 2 weeks ago. Neg 03/25/13.  Past Medical History  Diagnosis Date  . Depression     PPD after 2010 delivery   OB History   Grav Para Term Preterm Abortions TAB SAB Ect Mult Living   5 3 3  1  1   3      # Outc Date GA Lbr Len/2nd Wgt Sex Del Anes PTL Lv   1 TRM 2001 [redacted]w[redacted]d   M SVD EPI No Yes   2 TRM 2009 [redacted]w[redacted]d   F SVD None No Yes   3 TRM 2010 [redacted]w[redacted]d   F SVD EPI No Yes   4 SAB            Comments: System Generated. Please review and update pregnancy details.   5 CUR              Past Surgical History  Procedure Laterality Date  . No past surgeries    . Dilation and evacuation  11/06/2012    Procedure: DILATATION AND EVACUATION;  Surgeon: Catalina Antigua, MD;  Location: [facility];  Service: Gynecology;  Laterality: N/A;   History   Social History  . Marital Status: Single    Spouse Name: N/A    Number of Children: N/A  . Years of Education: N/A   Occupational History  . Not on file.   Social History Main Topics  . Smoking status: Current Every Day Smoker -- 0.25 packs/day for 4 years    Types: Cigarettes  . Smokeless tobacco: Never Used  . Alcohol Use: Yes     Comment: occasionally  . Drug Use: No  . Sexually Active: Yes    Birth Control/ Protection: None   Other Topics Concern  . Not on file   Social History Narrative  . No narrative on file   No current facility-administered medications on file prior to encounter.   Current Outpatient Prescriptions on File Prior to  Encounter  Medication Sig Dispense Refill  . ondansetron (ZOFRAN) 4 MG tablet Take 1 tablet (4 mg total) by mouth every 6 (six) hours.  12 tablet  0  . traMADol (ULTRAM) 50 MG tablet Take 1 tablet (50 mg total) by mouth every 6 (six) hours as needed for pain.  15 tablet  0   No Known Allergies  ROS: Pertinent items in HPI  OBJECTIVE Blood pressure 111/61, pulse 72, temperature 98 F (36.7 C), temperature source Oral, resp. rate 18, height 5\' 4"  (1.626 m), weight 70.789 kg (156 lb 1 oz), last menstrual period 03/10/2013. GENERAL: Well-developed, well-nourished female in no acute distress.  HEENT: Normocephalic HEART: normal rate RESP: normal effort ABDOMEN: Soft, non-tender EXTREMITIES: Nontender, no edema NEURO: Alert and oriented SPECULUM EXAM: Deferred BIMANUAL: Deferred  LAB RESULTS Results for orders placed during the hospital encounter of 04/13/13 (from the past 24 hour(s))  POCT PREGNANCY, URINE     Status: Abnormal   Collection Time    04/14/13  1:55 AM      Result Value Range  Preg Test, Ur POSITIVE (*) NEGATIVE  CBC     Status: Abnormal   Collection Time    04/14/13  2:55 AM      Result Value Range   WBC 5.4  4.0 - 10.5 K/uL   RBC 3.81 (*) 3.87 - 5.11 MIL/uL   Hemoglobin 11.5 (*) 12.0 - 15.0 g/dL   HCT 09.8 (*) 11.9 - 14.7 %   MCV 87.9  78.0 - 100.0 fL   MCH 30.2  26.0 - 34.0 pg   MCHC 34.3  30.0 - 36.0 g/dL   RDW 82.9  56.2 - 13.0 %   Platelets 219  150 - 400 K/uL    IMAGING US Ob Comp Less 14 Wks  04/14/2013   *RADIOLOGY REPORT*  Clinical Data: Cramping and worsening pelvic pressure.  Previous history spontaneous abortion.  Estimated gestational age by LMP is 5 weeks 0 days.  Quantitative HCG is pending.  OBSTETRIC <14 WK [place] AND TRANSVAGINAL OB US  Technique:  Both transabdominal and transvaginal ultrasound examinations were performed for complete evaluation of the gestation as well as the maternal uterus, adnexal regions, and pelvic cul-de-sac.  Transvaginal  technique was performed to assess early pregnancy.  Comparison:  None.  Intrauterine gestational sac:  A single intrauterine gestational sac is visualized.  Yolk sac: The yolk sac is visualized. Embryo: Fetal pole is not visualized, likely due to early gestational age. Cardiac Activity: Fetal cardiac activity is not visualized.  MSD: 5 mm  5 w 0 d      [place] EDC: 12/15/2013  Maternal uterus/adnexae: A moderate sized subchorionic hemorrhage is identified.  No myometrial masses are suggested.  The uterus appears retroverted.  The right ovary measures 3.3 x 2.6 x 2.3 cm.  There is a fairly homogeneous hyperechoic structure within the right ovary measuring about 2.5 cm diameter.  This is likely a fat containing lesion such as a dermoid. This measures slightly larger than on the previous study.  The left ovary measures 1.7 x 2.9 x 1.4 cm.  Normal follicular changes are present.  No abnormal left adnexal masses.  No free pelvic fluid collections.  IMPRESSION: Single intrauterine gestational sac.  Probable early intrauterine gestational sac, but no  fetal pole, or cardiac activity yet visualized.  Recommend follow-up quantitative B-HCG levels and follow-up US in 14 days to confirm and assess viability. This recommendation follows SRU consensus guidelines: Diagnostic Criteria for Nonviable Pregnancy Early in the First Trimester.  Malva Limes Med 2013; 865:7846-96.  Right ovarian dermoid cyst demonstrates mild enlargement since previous study.   Original Report Authenticated By: Burman Nieves, M.D.   US Ob Transvaginal  04/14/2013   *RADIOLOGY REPORT*  Clinical Data: Cramping and worsening pelvic pressure.  Previous history spontaneous abortion.  Estimated gestational age by LMP is 5 weeks 0 days.  Quantitative HCG is pending.  OBSTETRIC <14 WK [place] AND TRANSVAGINAL OB US  Technique:  Both transabdominal and transvaginal ultrasound examinations were performed for complete evaluation of the gestation as well as the maternal  uterus, adnexal regions, and pelvic cul-de-sac.  Transvaginal technique was performed to assess early pregnancy.  Comparison:  None.  Intrauterine gestational sac:  A single intrauterine gestational sac is visualized.  Yolk sac: The yolk sac is visualized. Embryo: Fetal pole is not visualized, likely due to early gestational age. Cardiac Activity: Fetal cardiac activity is not visualized.  MSD: 5 mm  5 w 0 d      [place] EDC: 12/15/2013  Maternal uterus/adnexae: A  moderate sized subchorionic hemorrhage is identified.  No myometrial masses are suggested.  The uterus appears retroverted.  The right ovary measures 3.3 x 2.6 x 2.3 cm.  There is a fairly homogeneous hyperechoic structure within the right ovary measuring about 2.5 cm diameter.  This is likely a fat containing lesion such as a dermoid. This measures slightly larger than on the previous study.  The left ovary measures 1.7 x 2.9 x 1.4 cm.  Normal follicular changes are present.  No abnormal left adnexal masses.  No free pelvic fluid collections.  IMPRESSION: Single intrauterine gestational sac.  Probable early intrauterine gestational sac, but no  fetal pole, or cardiac activity yet visualized.  Recommend follow-up quantitative B-HCG levels and follow-up US in 14 days to confirm and assess viability. This recommendation follows SRU consensus guidelines: Diagnostic Criteria for Nonviable Pregnancy Early in the First Trimester.  Malva Limes Med 2013; 161:0960-45.  Right ovarian dermoid cyst demonstrates mild enlargement since previous study.   Original Report Authenticated By: Burman Nieves, M.D.   MAU COURSE  ASSESSMENT 1. Pelvic pain complicating pregnancy, antepartum, first trimester    PLAN Discharge home in stable condition. SAB precautions. Repeat US in 7-10 days for viability at Dr. Elsie Stain office.     Follow-up Information   Follow up with MARSHALL,BERNARD A, MD. (as scheduled)    Contact information:   [address] [place] [address] [phone]       Follow up with [facility]ERN[facility] ADMISSIONS. (As needed in emergencies)    Contact information:   [address] [id number] [place] [address] [phone]       Medication List         ondansetron 4 MG tablet  Commonly known as:  ZOFRAN  Take 1 tablet (4 mg total) by mouth every 6 (six) hours.     traMADol 50 MG tablet  Commonly known as:  ULTRAM  Take 1 tablet (50 mg total) by mouth every 6 (six) hours as needed for pain.       [place], CNM 04/14/2013  3:52 AM

## 2013-04-20 ENCOUNTER — Encounter (HOSPITAL_COMMUNITY): Payer: Self-pay | Admitting: *Deleted

## 2013-04-20 ENCOUNTER — Inpatient Hospital Stay (HOSPITAL_COMMUNITY): Payer: Medicaid Other

## 2013-04-20 ENCOUNTER — Inpatient Hospital Stay (HOSPITAL_COMMUNITY)
Admission: AD | Admit: 2013-04-20 | Discharge: 2013-04-21 | Disposition: A | Discharge status: Home / Self Care | Payer: Medicaid Other | Source: Ambulatory Visit | Attending: Obstetrics | Admitting: Obstetrics

## 2013-04-20 DIAGNOSIS — O26859 Spotting complicating pregnancy, unspecified trimester: Secondary | ICD-10-CM | POA: Insufficient documentation

## 2013-04-20 DIAGNOSIS — O418X10 Other specified disorders of amniotic fluid and membranes, first trimester, not applicable or unspecified: Secondary | ICD-10-CM

## 2013-04-20 NOTE — MAU Note (Signed)
Pt reports she is 6 weeks preg and started having spotting and cramping about 2 hours ago.

## 2013-04-20 NOTE — Progress Notes (Signed)
Pt states she has had a headache for about 3 days and has been taking tylenol.

## 2013-04-20 NOTE — MAU Provider Note (Signed)
Chief Complaint: Dysmenorrhea and Vaginal Bleeding   First Provider Initiated Contact with Patient 04/21/13 0033     SUBJECTIVE HPI: Misty Shaw is a 27 y.o. [id number] at [redacted]w[redacted]d by LMP who presents with spotting x 2 hours and cramping similar to previous MAU visit. Neg GC/CT 03/25/13 in MAU and last week at Dr. Elsie Stain office. [place] 04/14/13 showed GS and YS. No FP and small right dermoid. Denies passage of tissue, urinary complaint and GI complaints.   Past Medical History  Diagnosis Date  . Depression     PPD after 2010 delivery   OB History   Grav Para Term Preterm Abortions TAB SAB Ect Mult Living   5 3 3  1  1   3      # Outc Date GA Lbr Len/2nd Wgt Sex Del Anes PTL Lv   1 TRM 2001 [redacted]w[redacted]d   M SVD EPI No Yes   2 TRM 2009 [redacted]w[redacted]d   F SVD None No Yes   3 TRM 2010 [redacted]w[redacted]d   F SVD EPI No Yes   4 SAB            Comments: System Generated. Please review and update pregnancy details.   5 CUR              Past Surgical History  Procedure Laterality Date  . No past surgeries    . Dilation and evacuation  11/06/2012    Procedure: DILATATION AND EVACUATION;  Surgeon: Catalina Antigua, MD;  Location: [facility];  Service: Gynecology;  Laterality: N/A;   History   Social History  . Marital Status: Single    Spouse Name: N/A    Number of Children: N/A  . Years of Education: N/A   Occupational History  . Not on file.   Social History Main Topics  . Smoking status: Current Every Day Smoker -- 0.25 packs/day for 4 years    Types: Cigarettes  . Smokeless tobacco: Never Used  . Alcohol Use: Yes     Comment: occasionally  . Drug Use: No  . Sexually Active: Yes    Birth Control/ Protection: None   Other Topics Concern  . Not on file   Social History Narrative  . No narrative on file   No current facility-administered medications on file prior to encounter.   Current Outpatient Prescriptions on File Prior to Encounter  Medication Sig Dispense Refill  . ondansetron (ZOFRAN) 4 MG tablet  Take 1 tablet (4 mg total) by mouth every 6 (six) hours.  12 tablet  0   No Known Allergies  ROS: Pertinent items in HPI  OBJECTIVE Blood pressure 115/61, pulse 79, temperature 98 F (36.7 C), temperature source Oral, resp. rate 18, height 5' 4.5" (1.638 m), weight 73.936 kg (163 lb), last menstrual period 03/10/2013, SpO2 100.00%. GENERAL: Well-developed, well-nourished female in no acute distress.  HEENT: Normocephalic HEART: normal rate RESP: normal effort ABDOMEN: Soft, non-tender EXTREMITIES: Nontender, no edema NEURO: Alert and oriented SPECULUM EXAM: Deferred  LAB RESULTS No results found for this or any previous visit (from the past 24 hour(s)).  IMAGING US Ob Comp Less 14 Wks  04/14/2013   *RADIOLOGY REPORT*  Clinical Data: Cramping and worsening pelvic pressure.  Previous history spontaneous abortion.  Estimated gestational age by LMP is 5 weeks 0 days.  Quantitative HCG is pending.  OBSTETRIC <14 WK [place] AND TRANSVAGINAL OB US  Technique:  Both transabdominal and transvaginal ultrasound examinations were performed for complete evaluation of the gestation  as well as the maternal uterus, adnexal regions, and pelvic cul-de-sac.  Transvaginal technique was performed to assess early pregnancy.  Comparison:  None.  Intrauterine gestational sac:  A single intrauterine gestational sac is visualized.  Yolk sac: The yolk sac is visualized. Embryo: Fetal pole is not visualized, likely due to early gestational age. Cardiac Activity: Fetal cardiac activity is not visualized.  MSD: 5 mm  5 w 0 d      [place] EDC: 12/15/2013  Maternal uterus/adnexae: A moderate sized subchorionic hemorrhage is identified.  No myometrial masses are suggested.  The uterus appears retroverted.  The right ovary measures 3.3 x 2.6 x 2.3 cm.  There is a fairly homogeneous hyperechoic structure within the right ovary measuring about 2.5 cm diameter.  This is likely a fat containing lesion such as a dermoid. This measures  slightly larger than on the previous study.  The left ovary measures 1.7 x 2.9 x 1.4 cm.  Normal follicular changes are present.  No abnormal left adnexal masses.  No free pelvic fluid collections.  IMPRESSION: Single intrauterine gestational sac.  Probable early intrauterine gestational sac, but no  fetal pole, or cardiac activity yet visualized.  Recommend follow-up quantitative B-HCG levels and follow-up US in 14 days to confirm and assess viability. This recommendation follows SRU consensus guidelines: Diagnostic Criteria for Nonviable Pregnancy Early in the First Trimester.  Malva Limes Med 2013; 409:8119-14.  Right ovarian dermoid cyst demonstrates mild enlargement since previous study.   Original Report Authenticated By: Burman Nieves, M.D.   US Ob Transvaginal  04/20/2013   *RADIOLOGY REPORT*  Clinical Data: Spotting and cramping.  OBSTETRIC <14 WK ULTRASOUND TRANSVAGINAL  Technique:  Transabdominal ultrasound was performed for evaluation of the gestation as well as the maternal uterus and adnexal regions.  Comparison:  04/14/2013 and 03/25/2013  Intrauterine gestational sac: Visualized/normal in shape. Yolk sac: Present Embryo: Present Cardiac Activity: Present Heart Rate: 84 bpm  CRL:  2.8 mm  6 w  0 d        [place] EDC: 12/14/2013  Maternal uterus/Adnexae: Uterus is retroflexed.  There is an intrauterine gestational sac. Fetal pole and yolk sac are present.  Evidence for a small subchorionic hemorrhage.  Small amount of free fluid.  The right ovary measures 3.4 x 2.3 x 1.8 cm. There is a focal echogenic lesion involving the right ovary.  The echogenic lesion measures 1.8 x 2.0 x 1.6 cm. Findings are compatible with a dermoid and similar to the previous exam.  IMPRESSION: Single live intrauterine pregnancy.  Calculated gestational age is 6 weeks and 0 days.  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   Original Report Authenticated By: Richarda Overlie, M.D.   MAU COURSE No bleeding while in  MAU.  ASSESSMENT 1. Subchorionic hemorrhage in first trimester     PLAN Discharge home in stable condition. SAB precautions. Pelvic rest x 1 week.     Follow-up Information   Follow up with Kathreen Cosier, MD On 05/11/2013.   Contact information:   [address] [place] [address] [phone]       Follow up with [facility]. (As needed if symptoms worsen)    Contact information:   [address] [id number] [place] [address] [phone]       Medication List    STOP taking these medications       traMADol 50 MG tablet  Commonly known as:  Janean Sark  TAKE these medications       acetaminophen-codeine 300-30 MG per tablet  Commonly known as:  TYLENOL #3  Take 1 tablet by mouth every 4 (four) hours as needed for pain. For headaches if Tylenol not effective. Do not take more than 4000 mg of Tylenol in 24 hours.     ondansetron 4 MG tablet  Commonly known as:  ZOFRAN  Take 1 tablet (4 mg total) by mouth every 6 (six) hours.       [place], [place] 04/21/2013  12:33 AM

## 2013-04-20 NOTE — MAU Note (Signed)
Pt states she had some bleeding tonight at about 2000. Pt states she went to use the bathroom and saw blood when she wiped

## 2013-04-21 DIAGNOSIS — O468X9 Other antepartum hemorrhage, unspecified trimester: Secondary | ICD-10-CM

## 2013-04-21 MED ORDER — ACETAMINOPHEN-CODEINE #3 300-30 MG PO TABS: 1.0000 | ORAL_TABLET | ORAL | Status: DC | PRN

## 2013-04-29 ENCOUNTER — Inpatient Hospital Stay (HOSPITAL_COMMUNITY)
Admission: AD | Admit: 2013-04-29 | Discharge: 2013-04-29 | Disposition: A | Discharge status: Home / Self Care | Payer: Medicaid Other | Source: Ambulatory Visit | Attending: Obstetrics | Admitting: Obstetrics

## 2013-04-29 DIAGNOSIS — O418X10 Other specified disorders of amniotic fluid and membranes, first trimester, not applicable or unspecified: Secondary | ICD-10-CM

## 2013-04-29 DIAGNOSIS — O209 Hemorrhage in early pregnancy, unspecified: Secondary | ICD-10-CM

## 2013-04-29 DIAGNOSIS — O26859 Spotting complicating pregnancy, unspecified trimester: Secondary | ICD-10-CM | POA: Insufficient documentation

## 2013-04-29 NOTE — MAU Provider Note (Signed)
History     CSN: [id number]  Arrival date and time: 04/29/13 1718   None     Chief Complaint  Patient presents with  . Vaginal Bleeding   HPI Misty Shaw is 27 y.o. [phone] [redacted]w[redacted]d weeks presenting with vaginal bleeding, "a small gush this afternoon".  Was seen on  04/13/13 and 04/20/13 for bleeding.  On ultrasound she had a small subchorionic hemorrhage.  She denies pain, cramping.  She has already begun prenatal care with Dr. Gaynell Face.    Past Medical History  Diagnosis Date  . Depression     PPD after 2010 delivery    Past Surgical History  Procedure Laterality Date  . No past surgeries    . Dilation and evacuation  11/06/2012    Procedure: DILATATION AND EVACUATION;  Surgeon: Catalina Antigua, MD;  Location: [facility];  Service: Gynecology;  Laterality: N/A;    Family History  Problem Relation Age of Onset  . Hypertension Mother     History  Substance Use Topics  . Smoking status: Current Every Day Smoker -- 0.25 packs/day for 4 years    Types: Cigarettes  . Smokeless tobacco: Never Used  . Alcohol Use: Yes     Comment: occasionally    Allergies: No Known Allergies  Prescriptions prior to admission  Medication Sig Dispense Refill  . acetaminophen-codeine (TYLENOL #3) 300-30 MG per tablet Take 1 tablet by mouth every 4 (four) hours as needed for pain. For headaches if Tylenol not effective. Do not take more than 4000 mg of Tylenol in 24 hours.  20 tablet  0  . ondansetron (ZOFRAN) 4 MG tablet Take 1 tablet (4 mg total) by mouth every 6 (six) hours.  12 tablet  0    Review of Systems  Constitutional: Negative for fever and chills.  Respiratory: Negative.   Cardiovascular: Negative.   Gastrointestinal: Negative for nausea, vomiting and abdominal pain.  Genitourinary: Negative for dysuria, urgency, frequency and hematuria.       + for a small gush of blood this afternoon  Neurological: Negative for headaches.   Physical Exam   Blood pressure 119/59, pulse 78,  temperature 98.6 F (37 C), temperature source Oral, resp. rate 16, height 5' 4.5" (1.638 m), weight 160 lb 6 oz (72.746 kg), last menstrual period 03/10/2013, SpO2 100.00%.  Physical Exam  Constitutional: She is oriented to person, place, and time. She appears well-developed and well-nourished. No distress.  HENT:  Head: Normocephalic.  Neck: Normal range of motion.  Cardiovascular: Normal rate.   Respiratory: Effort normal.  GI: Soft. She exhibits no distension and no mass. There is no tenderness. There is no rebound and no guarding.  Genitourinary: There is no rash, tenderness or lesion on the right labia. There is no rash, tenderness or lesion on the left labia. Uterus is enlarged (7-8 week size). Uterus is not tender. Right adnexum displays no mass, no tenderness and no fullness. Left adnexum displays no mass, no tenderness and no fullness. There is bleeding (no active bleeding noted.  A small amount of light pink blood on foxswab.  No clots) around the vagina. No tenderness around the vagina. No vaginal discharge found.  Cervix is closed  Neurological: She is alert and oriented to person, place, and time.  Skin: Skin is warm and dry.  Psychiatric: She has a normal mood and affect. Her behavior is normal.   BLOOD TYPE PER PREVIOUS RECORD IS AB Positive  MAU Course  Procedures  MDM  Assessment and Plan  A;  Spotting in early pregnancy [redacted]w[redacted]d gestation with known small subchorionic hemorrhage  P: Pelvic rest     Call Dr. Gaynell Face for increase bleeding     Keep scheduled appointment        KEY,EVE M 04/29/2013, 5:45 PM

## 2013-04-29 NOTE — MAU Note (Signed)
Patient is in to confirm that "everything is okay". She states that at 1545pm today she saw a moderate bleeding. She states that the bleeding have stopped now. She denies pain. She states that she was dx with ?hematoma on 04/14/13.

## 2013-04-30 ENCOUNTER — Encounter (HOSPITAL_COMMUNITY): Payer: Self-pay | Admitting: *Deleted

## 2013-04-30 ENCOUNTER — Inpatient Hospital Stay (HOSPITAL_COMMUNITY)
Admission: AD | Admit: 2013-04-30 | Discharge: 2013-04-30 | Disposition: A | Discharge status: Home / Self Care | Payer: Medicaid Other | Source: Ambulatory Visit | Attending: Obstetrics | Admitting: Obstetrics

## 2013-04-30 DIAGNOSIS — O209 Hemorrhage in early pregnancy, unspecified: Secondary | ICD-10-CM | POA: Insufficient documentation

## 2013-04-30 DIAGNOSIS — O468X1 Other antepartum hemorrhage, first trimester: Secondary | ICD-10-CM

## 2013-04-30 NOTE — MAU Provider Note (Signed)
Chief Complaint  Patient presents with  . Vaginal Bleeding    Subjective Misty Shaw 27 y.o.  [id number] at [redacted]w[redacted]d by LMP confirmed by ultrasound presents presents with another episode of vaginal bleeding. She describes getting up at 4:30 today and seeing a small gush of brown blood. Previous episode was 04/29/2013 on getting up and she was seen in MAU at that time. She was also seen her July 8th and 15th for bleeding episodes.No abdominal or pelvic pain. Following pelvic rest. Denies abnormal vaginal discharge or irritation. No dysuria or hematuria.  Viable IUP with small SCH on [place] 04/20/13. Had normal cx exam here yesterday. Had new OB visit at Dr. Elsie Stain and her next appointment is scheduled.  Blood type: AB pos  Problem list, past medical history, Ob/Gyn history, surgical history, family history and social history reviewed and updated as appropriate. Pertinent Medical History: [place] Pertinent Ob/Gyn History: SVD x3 Pertinent Surgical History: Amado Prescriptions prior to admission  Medication Sig Dispense Refill  . acetaminophen-codeine (TYLENOL #3) 300-30 MG per tablet Take 1 tablet by mouth every 4 (four) hours as needed for pain. For headaches if Tylenol not effective. Do not take more than 4000 mg of Tylenol in 24 hours.  20 tablet  0  . ondansetron (ZOFRAN) 4 MG tablet Take 1 tablet (4 mg total) by mouth every 6 (six) hours.  12 tablet  0  . Prenatal Vit-Fe Fumarate-FA (PRENATAL MULTIVITAMIN) TABS Take 1 tablet by mouth daily at 12 noon.        No Known Allergies   Objective   Filed Vitals:   04/30/13 1351  BP: 124/65  Pulse: 62  Temp:   Resp: 18     Physical Exam General: WN/WD in NAD  Abdom: soft, NT  Lab Results No results found for this or any previous visit (from the past 24 hour(s)).  Ultrasound  Bedside scan by me: fetal cardiac activity noted   Assessment 1. Bleeding in early pregnancy   2. Subchorionic hemorrhage in first trimester   Multip at [redacted]w[redacted]d  viable IUP   Plan     Discharge home with reassurance See AVS for pt education SAB precautions   Medication List         acetaminophen-codeine 300-30 MG per tablet  Commonly known as:  TYLENOL #3  Take 1 tablet by mouth every 4 (four) hours as needed for pain. For headaches if Tylenol not effective. Do not take more than 4000 mg of Tylenol in 24 hours.     ondansetron 4 MG tablet  Commonly known as:  ZOFRAN  Take 1 tablet (4 mg total) by mouth every 6 (six) hours.     prenatal multivitamin Tabs  Take 1 tablet by mouth daily at 12 noon.           Follow-up Information   Follow up with MARSHALL,BERNARD A, MD. (Keep you her scheduled appointment)    Contact information:   [address] [place] [address] [phone]        Tynan Boesel 04/30/2013 2:11 PM

## 2013-04-30 NOTE — MAU Note (Signed)
Misty Shaw is here for vaginal bleeding. She was seen last night for vaginal bleeding in early pregnancy and was told to follow up with her primary Dr. She has a known subchorionic hemorrhage that was seen on [place] about 4 weeks ago. She has mild lower back pain. Denies abdominal pain.

## 2013-05-02 ENCOUNTER — Inpatient Hospital Stay (HOSPITAL_COMMUNITY): Payer: Medicaid Other

## 2013-05-02 ENCOUNTER — Inpatient Hospital Stay (HOSPITAL_COMMUNITY)
Admission: AD | Admit: 2013-05-02 | Discharge: 2013-05-02 | Disposition: A | Discharge status: Home / Self Care | Payer: Medicaid Other | Source: Ambulatory Visit | Attending: Obstetrics | Admitting: Obstetrics

## 2013-05-02 ENCOUNTER — Encounter (HOSPITAL_COMMUNITY): Payer: Self-pay | Admitting: *Deleted

## 2013-05-02 DIAGNOSIS — O021 Missed abortion: Secondary | ICD-10-CM

## 2013-05-02 HISTORY — DX: Unspecified ovarian cyst, unspecified side: N83.209

## 2013-05-02 HISTORY — DX: Benign neoplasm of connective and other soft tissue, unspecified: D21.9

## 2013-05-02 MED ORDER — IBUPROFEN 800 MG PO TABS: 800.0000 mg | ORAL_TABLET | Freq: Three times a day (TID) | ORAL | Status: DC | PRN

## 2013-05-02 NOTE — MAU Note (Signed)
Was dx with subchorionic hemorrhage when here a couple days ago.  Is still bleeding, but passed ? Clot/tissue around 1100

## 2013-05-02 NOTE — MAU Provider Note (Signed)
History     CSN: [id number]  Arrival date and time: 05/02/13 1223   First Provider Initiated Contact with Patient 05/02/13 1304      Chief Complaint  Patient presents with  . Vaginal Bleeding   HPI 27 y.o. [id number] at [redacted]w[redacted]d with passage of clots today. Some cramping. Pt seen in MAU on 7/15 - U/S showed 6 week IUP, FHR in the 80s, small SCH. Ongoing bleeding since that time.   Past Medical History  Diagnosis Date  . Depression     PPD after 2010 delivery  . Ovarian cyst   . Fibroid     Past Surgical History  Procedure Laterality Date  . Dilation and evacuation  11/06/2012    Procedure: DILATATION AND EVACUATION;  Surgeon: Catalina Antigua, MD;  Location: [facility];  Service: Gynecology;  Laterality: N/A;    Family History  Problem Relation Age of Onset  . Hypertension Mother     History  Substance Use Topics  . Smoking status: Former Smoker -- 0.25 packs/day for 4 years    Types: Cigarettes  . Smokeless tobacco: Never Used     Comment: quit with + preg  . Alcohol Use: Yes     Comment: occasionally, not with preg    Allergies: No Known Allergies  Prescriptions prior to admission  Medication Sig Dispense Refill  . Prenatal Vit-Fe Fumarate-FA (PRENATAL MULTIVITAMIN) TABS Take 1 tablet by mouth daily at 12 noon.      Marland Kitchen acetaminophen-codeine (TYLENOL #3) 300-30 MG per tablet Take 1 tablet by mouth every 4 (four) hours as needed for pain. For headaches if Tylenol not effective. Do not take more than 4000 mg of Tylenol in 24 hours.  20 tablet  0  . ondansetron (ZOFRAN) 4 MG tablet Take 1 tablet (4 mg total) by mouth every 6 (six) hours.  12 tablet  0    Review of Systems  Constitutional: Negative.   Respiratory: Negative.   Cardiovascular: Negative.   Gastrointestinal: Negative for nausea, vomiting, abdominal pain, diarrhea and constipation.  Genitourinary: Negative for dysuria, urgency, frequency, hematuria and flank pain.       + cramping and bleeding    Musculoskeletal: Negative.   Neurological: Negative.   Psychiatric/Behavioral: Negative.    Physical Exam   Blood pressure 108/63, pulse 76, temperature 98.4 F (36.9 C), temperature source Oral, resp. rate 18, height 5' 4.5" (1.638 m), weight 162 lb (73.483 kg), last menstrual period 03/10/2013.  Physical Exam  Nursing note and vitals reviewed. Constitutional: She is oriented to person, place, and time. She appears well-developed and well-nourished. No distress.  Cardiovascular: Normal rate.   Respiratory: Effort normal.  GI: Soft. There is no tenderness.  Genitourinary:  + small blood, cervix closed   Musculoskeletal: Normal range of motion.  Neurological: She is alert and oriented to person, place, and time.  Skin: Skin is warm and dry.  Psychiatric: She has a normal mood and affect.    MAU Course  Procedures U/S shows 7.0 size IUP with no FHR today.   Assessment and Plan   1. Abortion, missed   Pt to follow up with Dr. Gaynell Face in 1 week, declines cytotec today, rev'd precautions    Medication List         acetaminophen-codeine 300-30 MG per tablet  Commonly known as:  TYLENOL #3  Take 1 tablet by mouth every 4 (four) hours as needed for pain. For headaches if Tylenol not effective. Do not take more than 4000 mg  of Tylenol in 24 hours.     ibuprofen 800 MG tablet  Commonly known as:  ADVIL,MOTRIN  Take 1 tablet (800 mg total) by mouth every 8 (eight) hours as needed for pain.     ondansetron 4 MG tablet  Commonly known as:  ZOFRAN  Take 1 tablet (4 mg total) by mouth every 6 (six) hours.     prenatal multivitamin Tabs  Take 1 tablet by mouth daily at 12 noon.            Follow-up Information   Follow up with MARSHALL,BERNARD A, MD. Schedule an appointment as soon as possible for a visit in 1 week.   Contact information:   [address] [place] [address] [phone]         Charnika Herbst 05/02/2013, 2:26 PM

## 2013-05-11 ENCOUNTER — Other Ambulatory Visit (HOSPITAL_COMMUNITY): Payer: Self-pay | Admitting: Obstetrics

## 2013-05-12 ENCOUNTER — Other Ambulatory Visit (HOSPITAL_COMMUNITY): Payer: Self-pay | Admitting: Obstetrics

## 2013-05-12 ENCOUNTER — Other Ambulatory Visit: Payer: Self-pay | Admitting: Advanced Practice Midwife

## 2013-05-12 ENCOUNTER — Inpatient Hospital Stay (HOSPITAL_COMMUNITY)
Admission: AD | Admit: 2013-05-12 | Discharge: 2013-05-12 | Disposition: A | Discharge status: Home / Self Care | Payer: Medicaid Other | Source: Ambulatory Visit | Attending: Obstetrics | Admitting: Obstetrics

## 2013-05-12 ENCOUNTER — Ambulatory Visit (HOSPITAL_COMMUNITY)
Admission: RE | Admit: 2013-05-12 | Discharge: 2013-05-12 | Disposition: A | Discharge status: Home / Self Care | Payer: Medicaid Other | Source: Ambulatory Visit | Attending: Obstetrics | Admitting: Obstetrics

## 2013-05-12 DIAGNOSIS — O034 Incomplete spontaneous abortion without complication: Secondary | ICD-10-CM | POA: Insufficient documentation

## 2013-05-12 DIAGNOSIS — O209 Hemorrhage in early pregnancy, unspecified: Secondary | ICD-10-CM | POA: Insufficient documentation

## 2013-05-12 DIAGNOSIS — O3680X0 Pregnancy with inconclusive fetal viability, not applicable or unspecified: Secondary | ICD-10-CM | POA: Insufficient documentation

## 2013-05-12 DIAGNOSIS — Z3689 Encounter for other specified antenatal screening: Secondary | ICD-10-CM | POA: Insufficient documentation

## 2013-05-12 MED ORDER — MISOPROSTOL 200 MCG PO TABS: ORAL_TABLET | ORAL | Status: DC

## 2013-05-12 MED ORDER — IBUPROFEN 600 MG PO TABS: 600.0000 mg | ORAL_TABLET | Freq: Four times a day (QID) | ORAL | Status: DC | PRN

## 2013-05-12 MED ORDER — OXYCODONE-ACETAMINOPHEN 5-325 MG PO TABS: 1.0000 | ORAL_TABLET | ORAL | Status: DC | PRN

## 2013-05-12 MED ORDER — PROMETHAZINE HCL 25 MG PO TABS: 25.0000 mg | ORAL_TABLET | Freq: Four times a day (QID) | ORAL | Status: DC | PRN

## 2013-05-12 NOTE — MAU Provider Note (Signed)
Ms. RHEANA CASEBOLT is a 27 y.o. [id number] at [redacted]w[redacted]d who presents to MAU today for follow-up US results. Fetal demise was diagnosed on 05/02/13. Patient declined Cytotec in MAU that day, but Rx for cytotec was sent from Dr. Gaynell Face. Patient used Cytotec on 05/04/13. She states that she had minimal bleeding afterwards and since then. She has had increased nausea and vomiting and headache recently.   BP 115/60  Pulse 58  Temp(Src) 97.8 F (36.6 C) (Oral)  Resp 18  LMP 03/10/2013 GENERAL: Well-developed, well-nourished female in no acute distress.  HEENT: Normocephalic, atraumatic.   LUNGS: Effort normal HEART: Regular rate  SKIN: Warm, dry and without erythema PSYCH: Normal mood and affect    MDM Discussed patient with Dr. Gaynell Face. Per patient her bleeding had stopped after taking the Cytotec. [place] was scheduled to ensure no retained products.  Dr. Gaynell Face would like one more trial of Cytotec with this patient today. Caution of bleeding. Follow-up in the office in 1-2 weeks.   A: Incomplete miscarriage  P: Discharge home Rx for Cytotec, percoect, ibuprofen and phenergan given/sent to patient's pharmacy Bleeding precautions discussed Patient advised to follow-up with Dr. Lamar Laundry in 1-2 weeks Patient may return to MAU as needed or if her condition were to change or worsen  Freddi Starr, PA-C 05/12/2013 11:58 AM

## 2013-08-25 IMAGING — US US OB COMP LESS 14 WK
1 series · 14 of 28 positions shown · non-contrast
Comparison: None.

CLINICAL DATA: Pelvic pain.  4-week-7-day gestational age by LMP.

OBSTETRIC <14 WK US AND TRANSVAGINAL OB US
TECHNIQUE: Both transabdominal and transvaginal ultrasound
examinations were performed for complete evaluation of the
gestation as well as the maternal uterus, adnexal regions, and
pelvic cul-de-sac.  Transvaginal technique was performed to assess
early pregnancy.

[Series 1: us ob comp less 14 wk · 0.18mm/px · 14 of 42 slices shown]
[im 2/42]
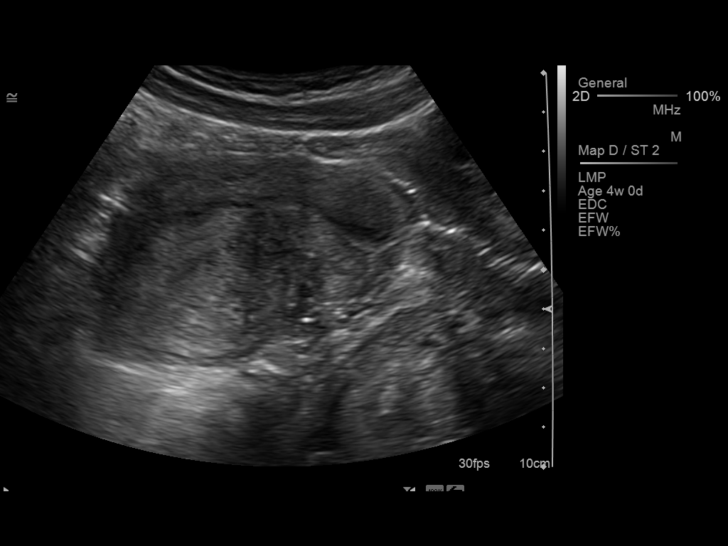
[im 5/42]
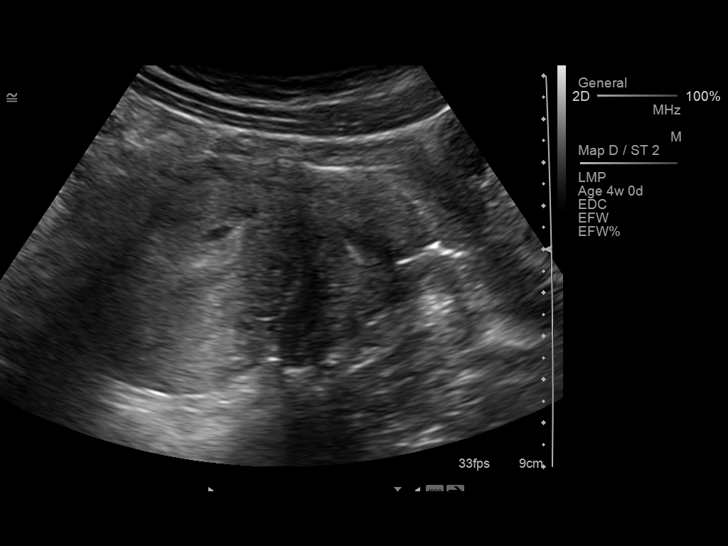
[im 8/42]
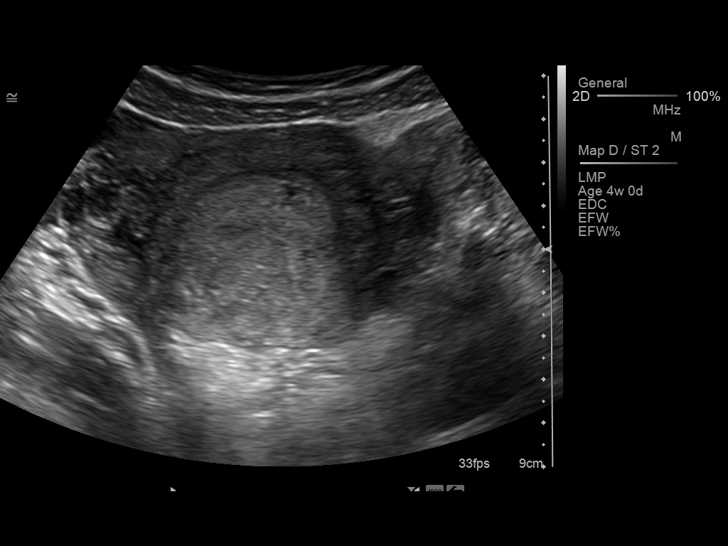
[im 11/42]
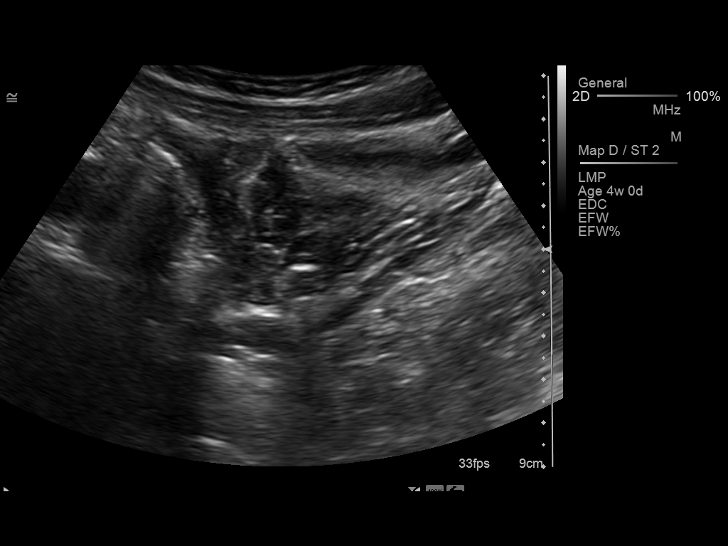
[im 14/42]
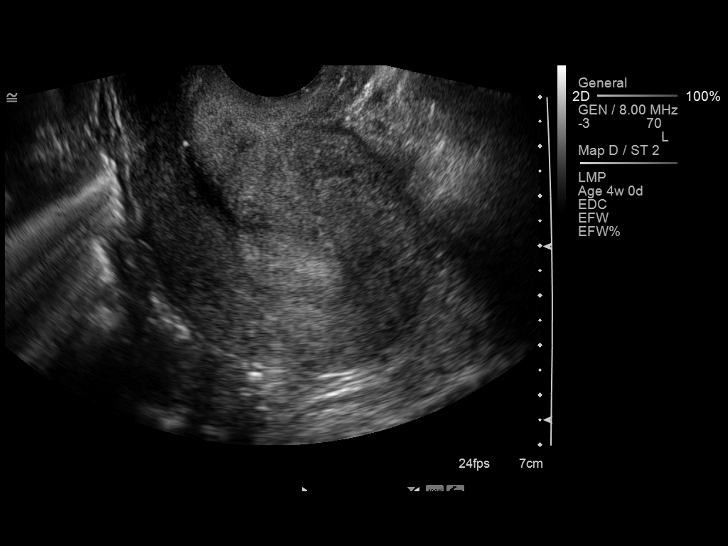
[im 17/42]
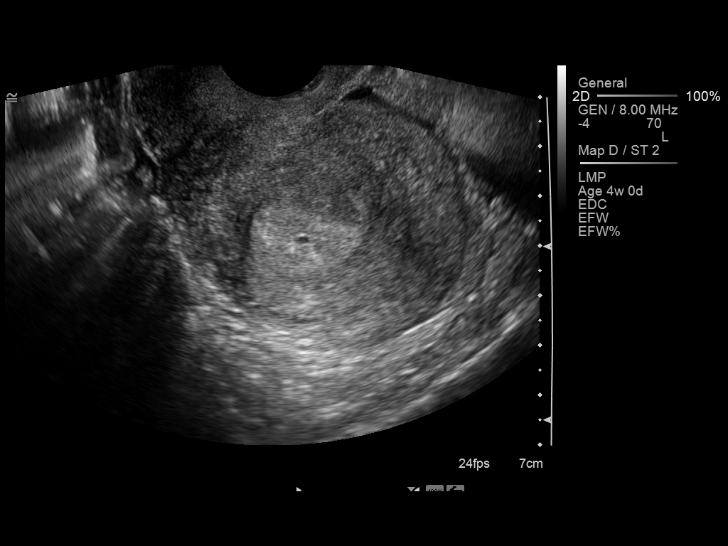
[im 20/42]
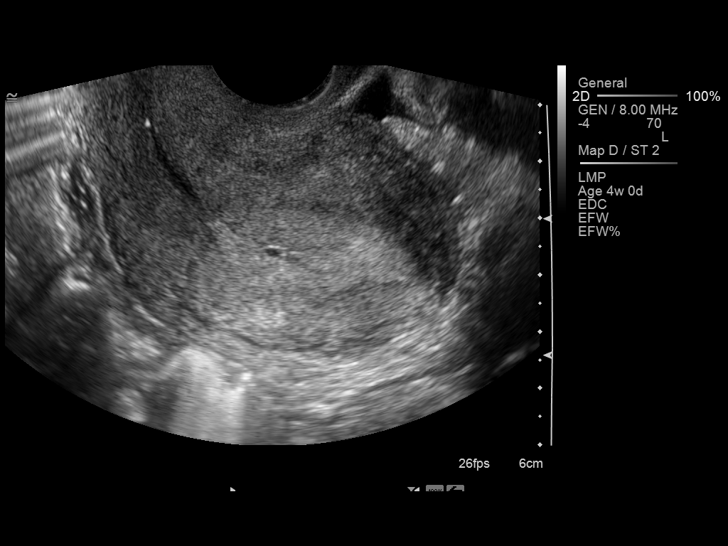
[im 23/42]
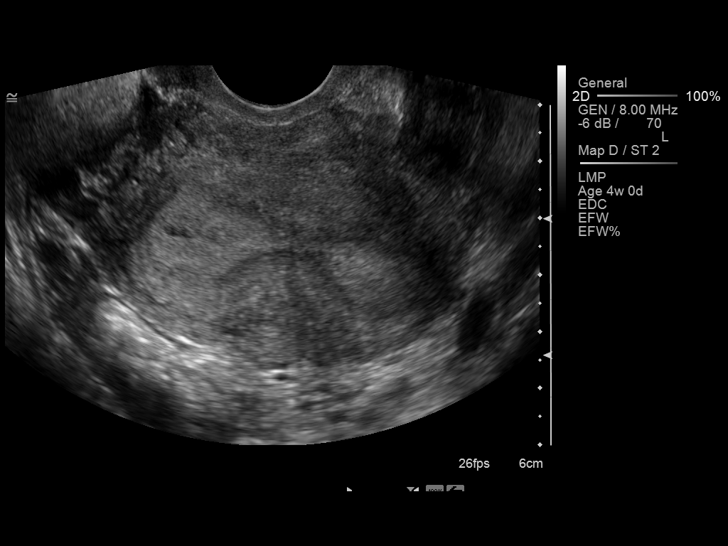
[im 26/42]
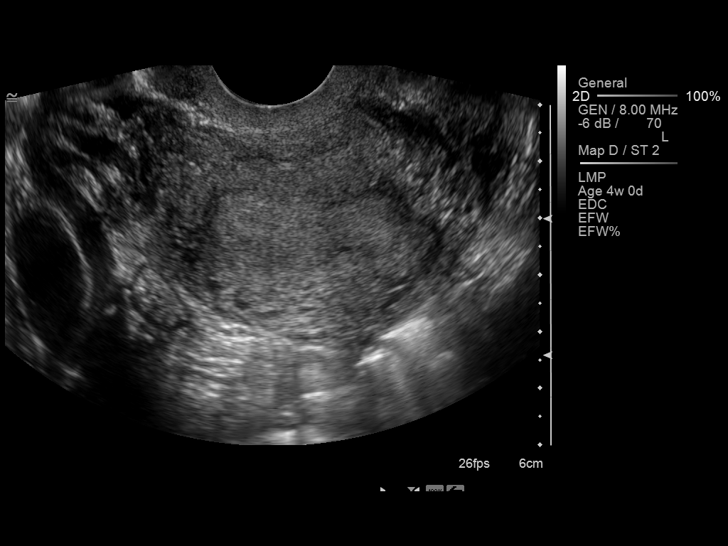
[im 29/42]
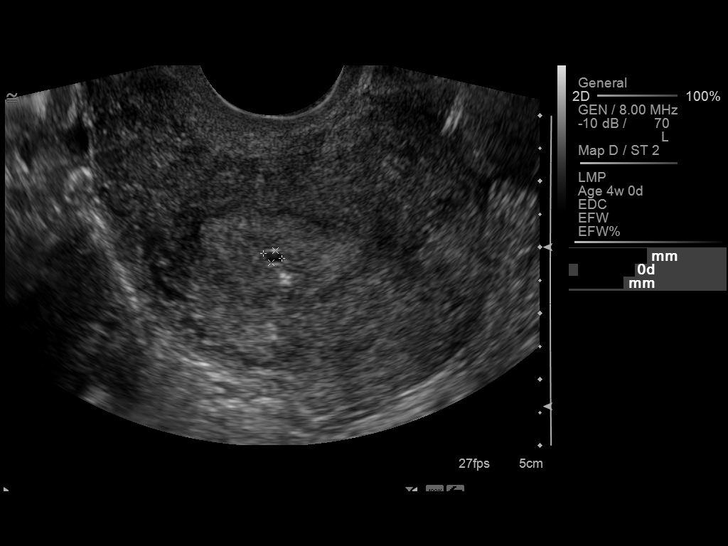
[im 32/42]
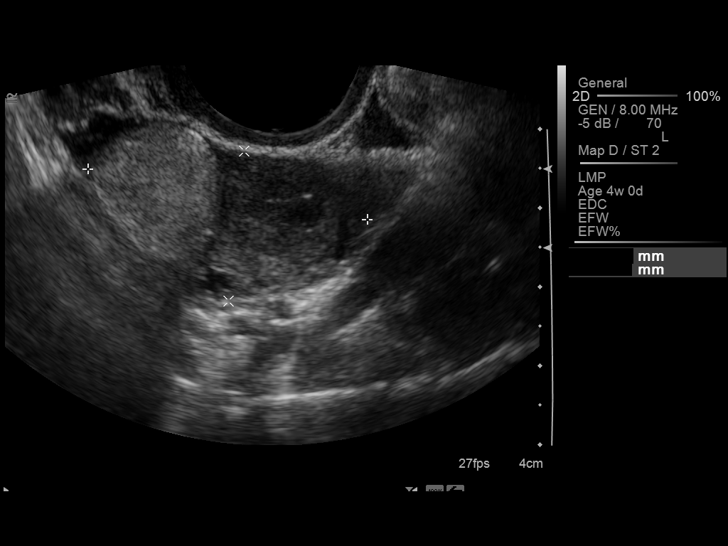
[im 35/42]
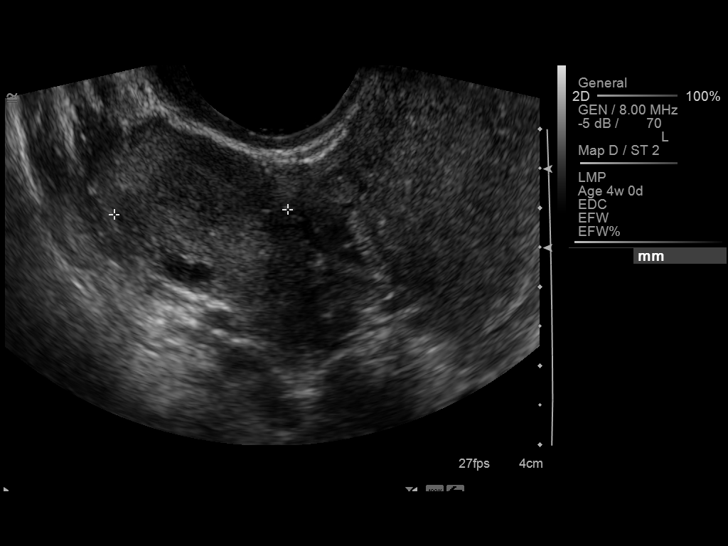
[im 38/42]
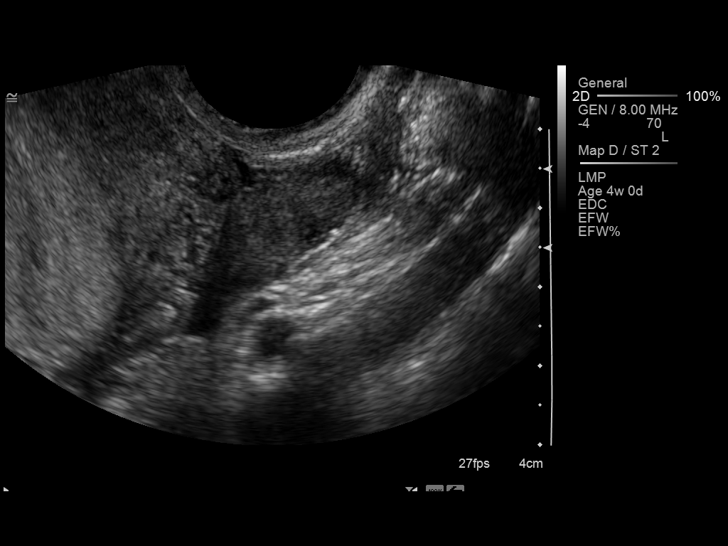
[im 42/42]
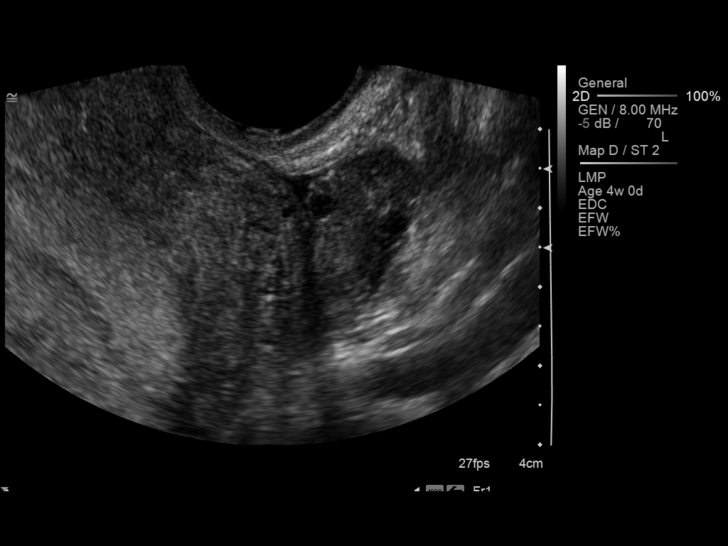

[14 of 28 positions shown; findings below may reference images not displayed]

Intrauterine gestational sac:  Probable single early I U G S seen
Yolk sac: Not visualized
Embryo: Not visualized

MSD: 3 mm  5 w 0 d

Maternal uterus/adnexae:
A small to moderate subchorionic hemorrhage noted.  Normal
appearance of left ovary.  Hyperechoic mass is seen in the right
ovary measuring 1.9 cm in maximum diameter.  The right ovarian
corpus luteum also noted.  No other adnexal mass or free fluid
identified.
IMPRESSION: 1.  Probable early 5-week intrauterine gestational sac, with small
to moderate subchorionic hemorrhage.  Recommend continued follow-up
of beta HCG levels, with further ultrasound follow-up in 10-14 days
to assess pregnancy progression/viability.
2.  1.9 cm right ovarian dermoid incidentally noted.

## 2013-11-16 ENCOUNTER — Emergency Department (HOSPITAL_COMMUNITY)
Admission: EM | Admit: 2013-11-16 | Discharge: 2013-11-16 | Disposition: A | Discharge status: Home / Self Care | Payer: Medicaid Other | Attending: Emergency Medicine | Admitting: Emergency Medicine

## 2013-11-16 ENCOUNTER — Encounter (HOSPITAL_COMMUNITY): Payer: Self-pay | Admitting: Emergency Medicine

## 2013-11-16 DIAGNOSIS — R358 Other polyuria: Secondary | ICD-10-CM | POA: Insufficient documentation

## 2013-11-16 DIAGNOSIS — Z8659 Personal history of other mental and behavioral disorders: Secondary | ICD-10-CM | POA: Insufficient documentation

## 2013-11-16 DIAGNOSIS — R05 Cough: Secondary | ICD-10-CM | POA: Insufficient documentation

## 2013-11-16 DIAGNOSIS — O99330 Smoking (tobacco) complicating pregnancy, unspecified trimester: Secondary | ICD-10-CM | POA: Insufficient documentation

## 2013-11-16 DIAGNOSIS — O9989 Other specified diseases and conditions complicating pregnancy, childbirth and the puerperium: Secondary | ICD-10-CM | POA: Insufficient documentation

## 2013-11-16 DIAGNOSIS — Z79899 Other long term (current) drug therapy: Secondary | ICD-10-CM | POA: Insufficient documentation

## 2013-11-16 DIAGNOSIS — R059 Cough, unspecified: Secondary | ICD-10-CM | POA: Insufficient documentation

## 2013-11-16 DIAGNOSIS — R3589 Other polyuria: Secondary | ICD-10-CM | POA: Insufficient documentation

## 2013-11-16 DIAGNOSIS — R110 Nausea: Secondary | ICD-10-CM | POA: Insufficient documentation

## 2013-11-16 DIAGNOSIS — R350 Frequency of micturition: Secondary | ICD-10-CM | POA: Insufficient documentation

## 2013-11-16 DIAGNOSIS — R109 Unspecified abdominal pain: Secondary | ICD-10-CM | POA: Insufficient documentation

## 2013-11-16 DIAGNOSIS — R197 Diarrhea, unspecified: Secondary | ICD-10-CM | POA: Insufficient documentation

## 2013-11-16 DIAGNOSIS — Z3201 Encounter for pregnancy test, result positive: Secondary | ICD-10-CM

## 2013-11-16 DIAGNOSIS — Z8742 Personal history of other diseases of the female genital tract: Secondary | ICD-10-CM | POA: Insufficient documentation

## 2013-11-16 LAB — URINALYSIS, ROUTINE W REFLEX MICROSCOPIC
Bilirubin Urine: NEGATIVE
Glucose, UA: NEGATIVE mg/dL
HGB URINE DIPSTICK: NEGATIVE
Ketones, ur: NEGATIVE mg/dL
LEUKOCYTES UA: NEGATIVE
NITRITE: NEGATIVE
Protein, ur: NEGATIVE mg/dL
SPECIFIC GRAVITY, URINE: 1.015 (ref 1.005–1.030)
UROBILINOGEN UA: 0.2 mg/dL (ref 0.0–1.0)
pH: 6.5 (ref 5.0–8.0)

## 2013-11-16 LAB — PREGNANCY, URINE: PREG TEST UR: POSITIVE — AB

## 2013-11-16 NOTE — ED Notes (Signed)
Nausea, abdominal cramping, diarrhea x 1 week. Cough since Sun, nonproductive.

## 2013-11-16 NOTE — Discharge Instructions (Signed)
Pregnancy test positive.   Follow up with ob gyn dr.  Phone number given.  No new prescriptions.

## 2013-11-16 NOTE — ED Provider Notes (Signed)
CSN: [id number]     Arrival date & time 11/16/13  1301 History  This chart was scribed for Nat Christen, MD,  by Stacy Gardner, ED Scribe. The patient was seen in room APA18/APA18 and the patient's care was started at 1:42 PM.   First MD Initiated Contact with Patient 11/16/13 1316     Chief Complaint  Patient presents with  . Abdominal Pain     (Consider location/radiation/quality/duration/timing/severity/associated sxs/prior Treatment) Patient is a 28 y.o. female presenting with abdominal pain. The history is provided by the patient and medical records. No language interpreter was used.  Abdominal Pain Pain location:  Suprapubic Associated symptoms: cough, diarrhea and nausea   Associated symptoms: no dysuria, no vaginal bleeding, no vaginal discharge and no vomiting    HPI Comments: Misty Shaw is a 28 y.o. female who presents to the Emergency Department complaining of constant, moderate abdominal cramping for the past week. Her last mense was 1/18. Denies vaginal discharge, dysuria and vaginal bleeding. She has the associated symptom of nausea and diarrhea. Denies changes in appetite. She also has urinary frequency and increased urinary output. She had a ultrasound at [facility] that showed that she had a dermoid cyst.  Pt also has the associated symptoms of nausea and diarrhea for the past week. She had a non-productive cough for the past two days.     Past Medical History  Diagnosis Date  . Depression     PPD after 2010 delivery  . Ovarian cyst   . Fibroid    Past Surgical History  Procedure Laterality Date  . Dilation and evacuation  11/06/2012    Procedure: DILATATION AND EVACUATION;  Surgeon: Mora Bellman, MD;  Location: [facility];  Service: Gynecology;  Laterality: N/A;   Family History  Problem Relation Age of Onset  . Hypertension Mother    History  Substance Use Topics  . Smoking status: Current Some Day Smoker -- 0.25 packs/day for 4 years    Types:  Cigarettes  . Smokeless tobacco: Never Used     Comment: quit with + preg  . Alcohol Use: Yes     Comment: occasionally, not with preg   OB History   Grav Para Term Preterm Abortions TAB SAB Ect Mult Living   5 3 3  1  1   3      Review of Systems  Constitutional: Negative for appetite change.  Respiratory: Positive for cough.   Gastrointestinal: Positive for nausea and diarrhea. Negative for vomiting and abdominal pain.  Genitourinary: Positive for frequency. Negative for dysuria, decreased urine volume, vaginal bleeding, vaginal discharge and menstrual problem.  All other systems reviewed and are negative.      Allergies  Review of patient's allergies indicates no known allergies.  Home Medications   Current Outpatient Rx  Name  Route  Sig  Dispense  Refill  . ibuprofen (ADVIL,MOTRIN) 600 MG tablet   Oral   Take 1 tablet (600 mg total) by mouth every 6 (six) hours as needed for pain.   30 tablet   0   . misoprostol (CYTOTEC) 200 MCG tablet      Place 4 tabs (800 mcg) once vaginally   4 tablet   0   . oxyCODONE-acetaminophen (PERCOCET/ROXICET) 5-325 MG per tablet   Oral   Take 1 tablet by mouth every 4 (four) hours as needed for pain.   15 tablet   0   . Prenatal Vit-Fe Fumarate-FA (PRENATAL MULTIVITAMIN) TABS   Oral  Take 1 tablet by mouth daily at 12 noon.         . promethazine (PHENERGAN) 25 MG tablet   Oral   Take 1 tablet (25 mg total) by mouth every 6 (six) hours as needed for nausea.   30 tablet   0    BP 117/58  Pulse 69  Temp(Src) 98.2 F (36.8 C) (Oral)  Resp 14  Ht 5\' 6"  (1.676 m)  Wt 166 lb (75.297 kg)  BMI 26.81 kg/m2  SpO2 100%  LMP 10/24/2012  Breastfeeding? Unknown Physical Exam  Nursing note and vitals reviewed. Constitutional: She is oriented to person, place, and time. She appears well-developed and well-nourished.  HENT:  Head: Normocephalic and atraumatic.  Eyes: Conjunctivae and EOM are normal. Pupils are equal,  round, and reactive to light.  Neck: Normal range of motion. Neck supple.  Cardiovascular: Normal rate, regular rhythm and normal heart sounds.   Pulmonary/Chest: Effort normal and breath sounds normal.  Abdominal: Soft. Bowel sounds are normal. There is tenderness in the suprapubic area.  Musculoskeletal: Normal range of motion.  Neurological: She is alert and oriented to person, place, and time.  Skin: Skin is warm and dry.  Psychiatric: She has a normal mood and affect. Her behavior is normal.    ED Course  Procedures (including critical care time) DIAGNOSTIC STUDIES: Oxygen Saturation is 100% on room air, normal by my interpretation.    COORDINATION OF CARE:  1:46 PM Discussed course of care with pt which includes antiemetics, pain mediations. Pt understands and agrees.    Labs Review Labs Reviewed - No data to display Imaging Review No results found.  EKG Interpretation   None       MDM  Pregnancy test positive. No clinical evidence of ectopic pregnancy. Patient is hemodynamically stable. No acute abdomen. Recommend OB/GYN followup.  Final diagnoses:  None    I personally performed the services described in this documentation, which was scribed in my presence. The recorded information has been reviewed and is accurate.     Nat Christen, MD 11/16/13 [phone]

## 2014-02-27 ENCOUNTER — Encounter (HOSPITAL_COMMUNITY): Payer: Self-pay | Admitting: *Deleted

## 2014-02-27 ENCOUNTER — Inpatient Hospital Stay (HOSPITAL_COMMUNITY)
Admission: AD | Admit: 2014-02-27 | Discharge: 2014-02-27 | Disposition: A | Discharge status: Home / Self Care | Payer: Medicaid Other | Source: Ambulatory Visit | Attending: Obstetrics & Gynecology | Admitting: Obstetrics & Gynecology

## 2014-02-27 DIAGNOSIS — Z87891 Personal history of nicotine dependence: Secondary | ICD-10-CM | POA: Insufficient documentation

## 2014-02-27 DIAGNOSIS — R300 Dysuria: Secondary | ICD-10-CM | POA: Insufficient documentation

## 2014-02-27 DIAGNOSIS — O26899 Other specified pregnancy related conditions, unspecified trimester: Secondary | ICD-10-CM

## 2014-02-27 DIAGNOSIS — O9989 Other specified diseases and conditions complicating pregnancy, childbirth and the puerperium: Principal | ICD-10-CM

## 2014-02-27 DIAGNOSIS — O99891 Other specified diseases and conditions complicating pregnancy: Secondary | ICD-10-CM | POA: Insufficient documentation

## 2014-02-27 DIAGNOSIS — R51 Headache: Secondary | ICD-10-CM | POA: Insufficient documentation

## 2014-02-27 HISTORY — DX: Headache: R51

## 2014-02-27 LAB — URINALYSIS, ROUTINE W REFLEX MICROSCOPIC
BILIRUBIN URINE: NEGATIVE
GLUCOSE, UA: NEGATIVE mg/dL
HGB URINE DIPSTICK: NEGATIVE
Ketones, ur: NEGATIVE mg/dL
Leukocytes, UA: NEGATIVE
Nitrite: NEGATIVE
PROTEIN: NEGATIVE mg/dL
Specific Gravity, Urine: 1.01 (ref 1.005–1.030)
UROBILINOGEN UA: 0.2 mg/dL (ref 0.0–1.0)
pH: 7 (ref 5.0–8.0)

## 2014-02-27 MED ORDER — CYCLOBENZAPRINE HCL 10 MG PO TABS: 5.0000 mg | ORAL_TABLET | Freq: Three times a day (TID) | ORAL | Status: DC | PRN

## 2014-02-27 MED ORDER — PHENAZOPYRIDINE HCL 200 MG PO TABS: 200.0000 mg | ORAL_TABLET | Freq: Three times a day (TID) | ORAL | Status: DC | PRN

## 2014-02-27 NOTE — MAU Provider Note (Signed)
Chief Complaint: Urinary Tract Infection and Headache   First Provider Initiated Contact with Patient 02/27/14 1232     SUBJECTIVE HPI: Misty Shaw is a 28 y.o. [id number] at [redacted]w[redacted]d by LMP who presents to maternity admissions reporting daily headaches described as sometimes frontal, sometimes starting in her neck, with mild light sensitivity, no n/v.  She also reports urinary frequency with some sharp abdominal pain after urination. She saw her prenatal provider 2 days ago and started antibiotics.  A urine culture is pending.  She reports she drinks little water, and is sipping soda and juice .   Past Medical History  Diagnosis Date  . Depression     PPD after 2010 delivery  . Ovarian cyst   . Fibroid   . Headache(784.0)     migraines   Past Surgical History  Procedure Laterality Date  . Dilation and evacuation  11/06/2012    Procedure: DILATATION AND EVACUATION;  Surgeon: Mora Bellman, MD;  Location: [facility];  Service: Gynecology;  Laterality: N/A;   History   Social History  . Marital Status: Single    Spouse Name: N/A    Number of Children: N/A  . Years of Education: N/A   Occupational History  . Not on file.   Social History Main Topics  . Smoking status: Former Smoker -- 0.25 packs/day for 4 years    Types: Cigarettes  . Smokeless tobacco: Never Used     Comment: quit with + preg  . Alcohol Use: Yes     Comment: occasionally, not with preg  . Drug Use: No  . Sexual Activity: Yes    Birth Control/ Protection: None   Other Topics Concern  . Not on file   Social History Narrative  . No narrative on file   No current facility-administered medications on file prior to encounter.   No current outpatient prescriptions on file prior to encounter.   No Known Allergies  ROS: Pertinent items in HPI  OBJECTIVE Blood pressure 114/55, pulse 80, temperature 98.2 F (36.8 C), temperature source Oral, resp. rate 16, height 5\' 4"  (1.626 m), weight 75.751 kg (167  lb), last menstrual period 10/24/2013, not currently breastfeeding. GENERAL: Well-developed, well-nourished female in no acute distress.  HEENT: Normocephalic HEART: normal rate RESP: normal effort ABDOMEN: Soft, non-tender EXTREMITIES: Nontender, no edema Neurological - alert, oriented, normal speech, no focal findings or movement disorder noted, cranial nerves II through XII intact, DTR's normal and symmetric, motor and sensory grossly normal bilaterally  FHR 140 by doppler  LAB RESULTS Results for orders placed during the hospital encounter of 02/27/14 (from the past 24 hour(s))  URINALYSIS, ROUTINE W REFLEX MICROSCOPIC     Status: Abnormal   Collection Time    02/27/14 11:00 AM      Result Value Ref Range   Color, Urine YELLOW  YELLOW   APPearance HAZY (*) CLEAR   Specific Gravity, Urine 1.010  1.005 - 1.030   pH 7.0  5.0 - 8.0   Glucose, UA NEGATIVE  NEGATIVE mg/dL   Hgb urine dipstick NEGATIVE  NEGATIVE   Bilirubin Urine NEGATIVE  NEGATIVE   Ketones, ur NEGATIVE  NEGATIVE mg/dL   Protein, ur NEGATIVE  NEGATIVE mg/dL   Urobilinogen, UA 0.2  0.0 - 1.0 mg/dL   Nitrite NEGATIVE  NEGATIVE   Leukocytes, UA NEGATIVE  NEGATIVE     ASSESSMENT 1. Dysuria   2. Headache in pregnancy     PLAN Discharge home Increase PO fluids, recommend water  Flexeril 5-10 mg PO TID PRN headache Pyridium 200 mg TID PRN F/U on Tuesday with prenatal provider for urine culture results and to f/u on headache management Return to MAU as needed for emergencies    Medication List         amoxicillin 500 MG capsule  Commonly known as:  AMOXIL  Take 500 mg by mouth 3 (three) times daily.     cyclobenzaprine 10 MG tablet  Commonly known as:  FLEXERIL  Take 0.5-1 tablets (5-10 mg total) by mouth 3 (three) times daily as needed for muscle spasms.     phenazopyridine 200 MG tablet  Commonly known as:  PYRIDIUM  Take 1 tablet (200 mg total) by mouth 3 (three) times daily as needed for pain.      prenatal multivitamin Tabs tablet  Take 1 tablet by mouth daily at 12 noon.     promethazine 25 MG tablet  Commonly known as:  PHENERGAN  Take 25 mg by mouth every 6 (six) hours as needed for nausea or vomiting.       Follow-up Information   Please follow up. (Your prenatal provider in [place].  Return to MAU , As needed for emergencies)       Fatima Blank Certified Nurse-Midwife 02/27/2014  3:09 PM

## 2014-02-27 NOTE — MAU Note (Signed)
Pt states here for migraines daily. Was given rx for phenergan. Also here for frequency of urination as well as voiding in small amounts. Denies bleeding or vaginal discharge.

## 2014-02-27 NOTE — Discharge Instructions (Signed)

## 2014-02-27 NOTE — MAU Provider Note (Signed)
Attestation of Attending Supervision of Advanced Practitioner (CNM/NP): Evaluation and management procedures were performed by the Advanced Practitioner under my supervision and collaboration.  I have reviewed the Advanced Practitioner's note and chart, and I agree with the management and plan.  Lavonia Drafts 3:31 PM

## 2014-03-06 IMAGING — US US OB COMP LESS 14 WK
1 series · 13 of 28 positions shown · non-contrast
Comparison: None.

CLINICAL DATA: Cramping and worsening pelvic pressure.  Previous
history spontaneous abortion.  Estimated gestational age by LMP is
5 weeks 0 days.  Quantitative HCG is pending.

OBSTETRIC <14 WK US AND TRANSVAGINAL OB US
TECHNIQUE: Both transabdominal and transvaginal ultrasound
examinations were performed for complete evaluation of the
gestation as well as the maternal uterus, adnexal regions, and
pelvic cul-de-sac.  Transvaginal technique was performed to assess
early pregnancy.

[Series 1: us ob comp less 14 wks · 13 of 38 slices shown]
[im 2/38]
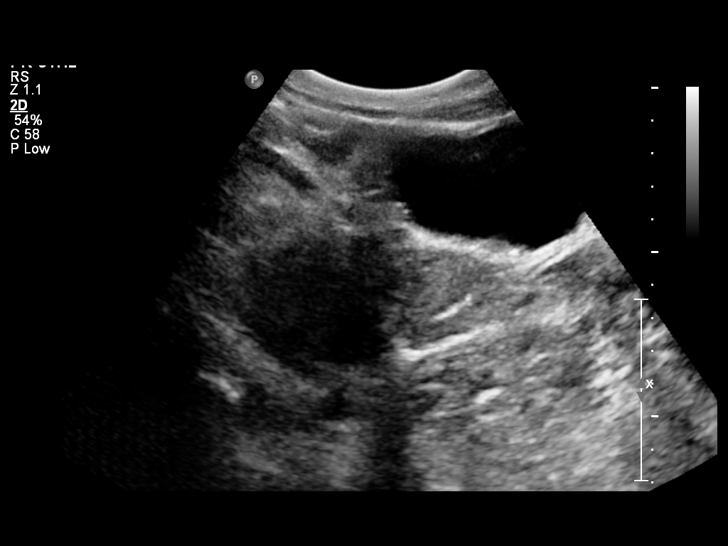
[im 5/38]
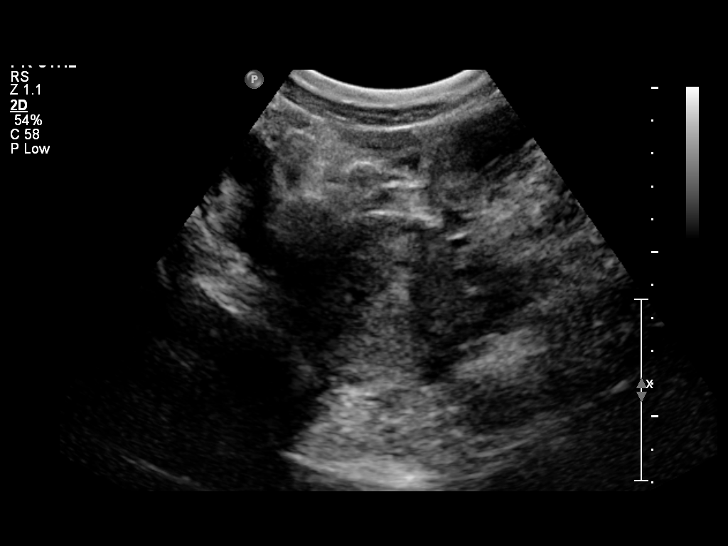
[im 7/38]
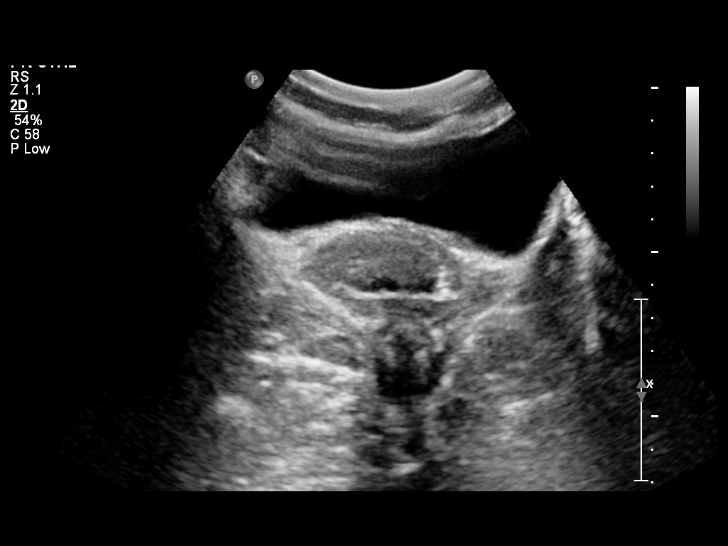
[im 10/38]
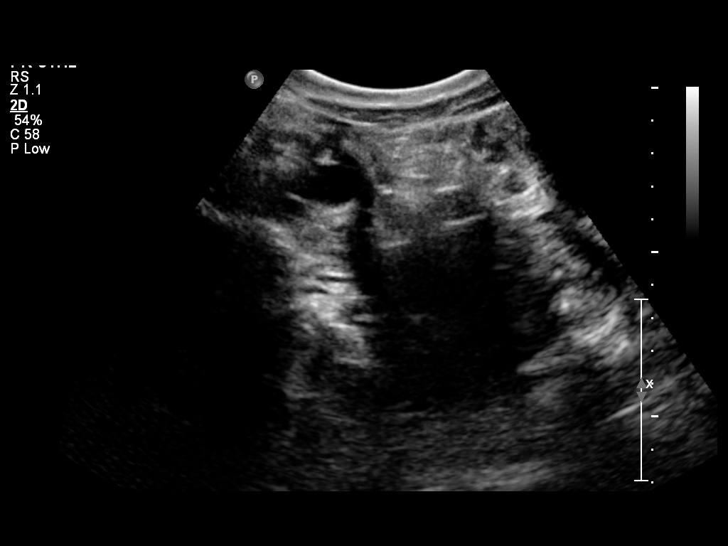
[im 13/38]
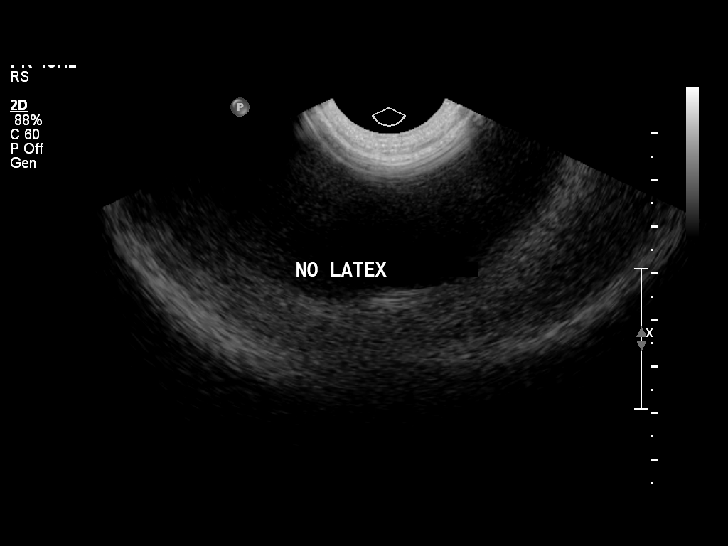
[im 16/38]
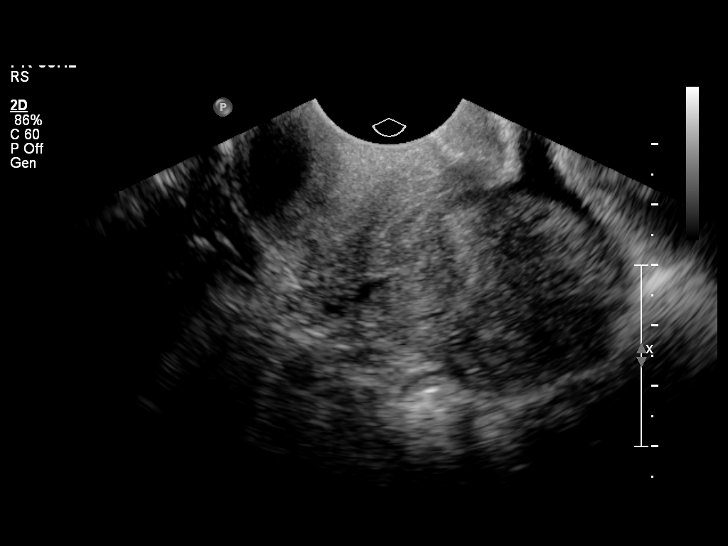
[im 20/38]
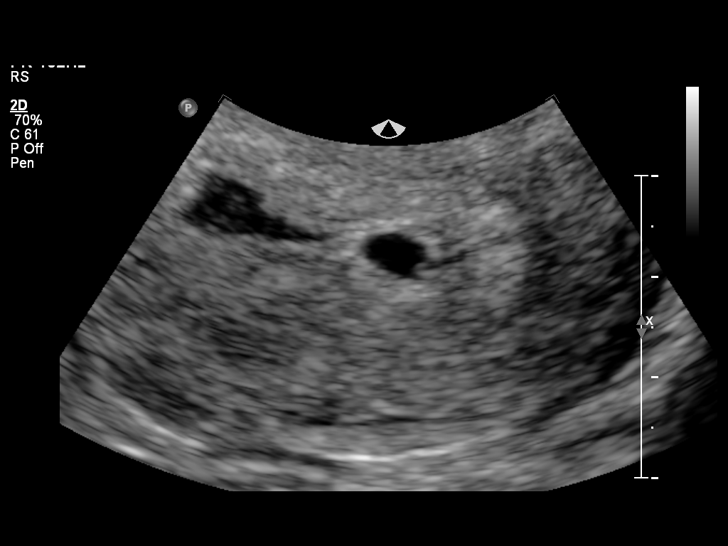
[im 22/38]
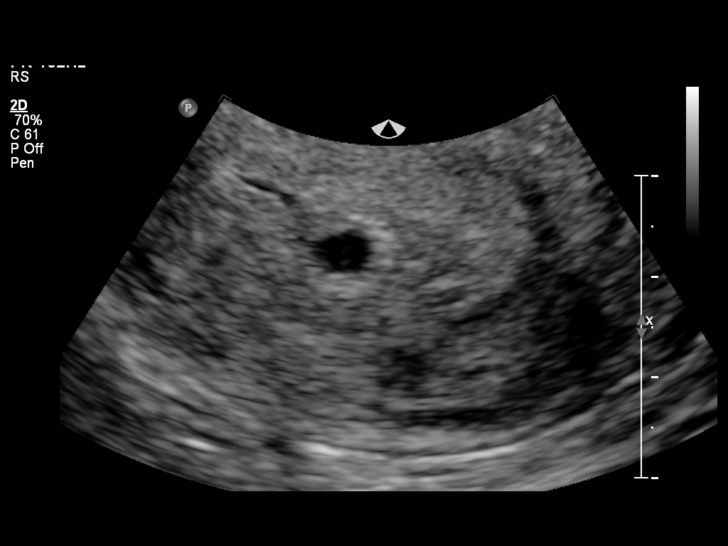
[im 25/38]
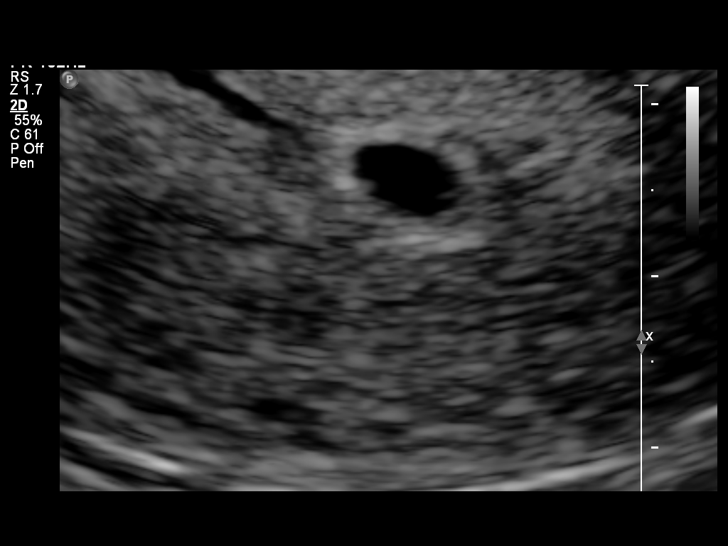
[im 28/38]
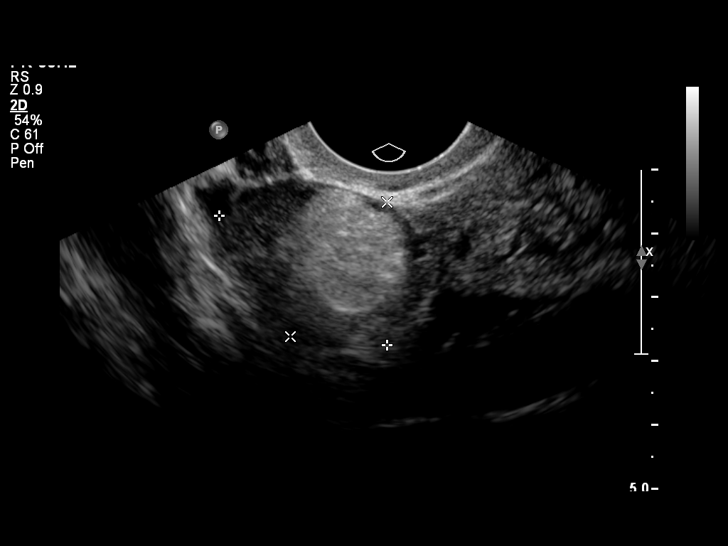
[im 31/38]
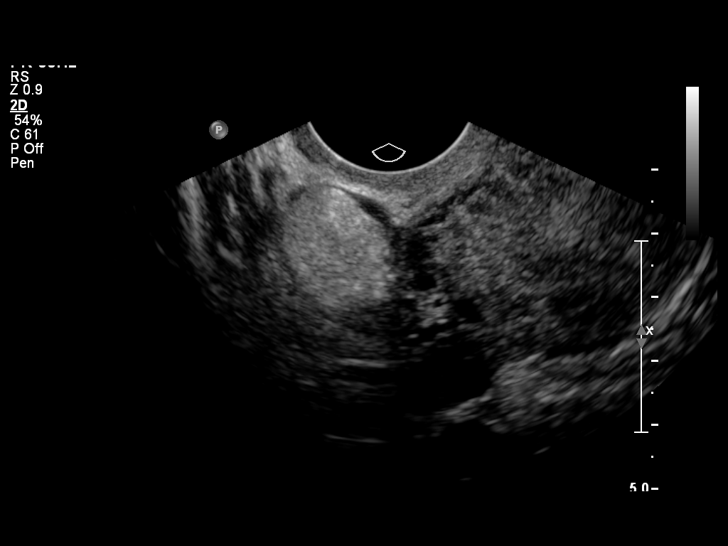
[im 33/38]
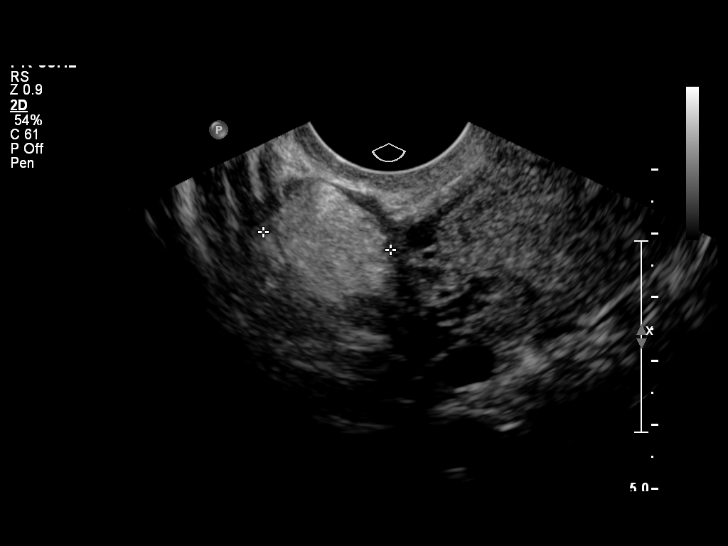
[im 36/38]
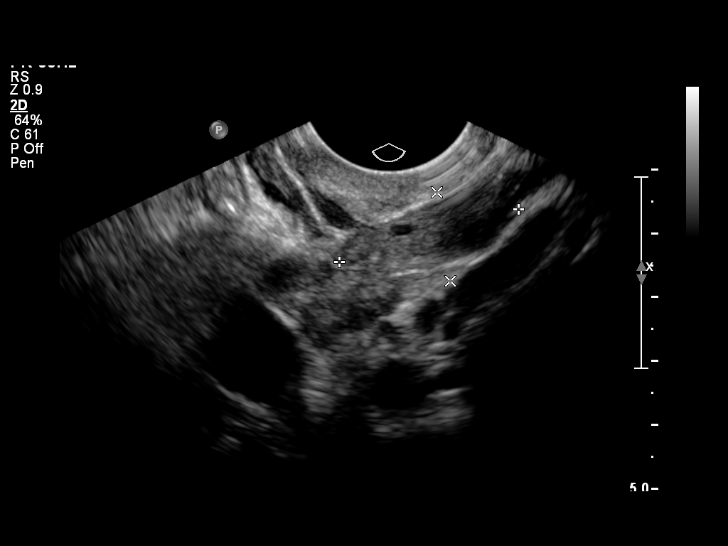

[13 of 28 positions shown; findings below may reference images not displayed]

Intrauterine gestational sac:  A single intrauterine gestational
sac is visualized.

Yolk sac: The yolk sac is visualized.
Embryo: Fetal pole is not visualized, likely due to early
gestational age.
Cardiac Activity: Fetal cardiac activity is not visualized.

MSD: 5 mm  5 w 0 d      US EDC: 12/15/2013

Maternal uterus/adnexae:
A moderate sized subchorionic hemorrhage is identified.  No
myometrial masses are suggested.  The uterus appears retroverted.

The right ovary measures 3.3 x 2.6 x 2.3 cm.  There is a fairly
homogeneous hyperechoic structure within the right ovary measuring
about 2.5 cm diameter.  This is likely a fat containing lesion such
as a dermoid. This measures slightly larger than on the previous
study.

The left ovary measures 1.7 x 2.9 x 1.4 cm.  Normal follicular
changes are present.  No abnormal left adnexal masses.

No free pelvic fluid collections.
IMPRESSION: Single intrauterine gestational sac.  Probable early intrauterine
gestational sac, but no  fetal pole, or cardiac activity yet
visualized.  Recommend follow-up quantitative B-HCG levels and
follow-up US in 14 days to confirm and assess viability. This
recommendation follows SRU consensus guidelines: Diagnostic
Criteria for Nonviable Pregnancy Early in the First Trimester.  N
Engl J Med 1545; [DATE].

Right ovarian dermoid cyst demonstrates mild enlargement since
previous study.

## 2014-03-11 ENCOUNTER — Inpatient Hospital Stay (HOSPITAL_COMMUNITY)
Admission: AD | Admit: 2014-03-11 | Discharge: 2014-03-12 | Disposition: A | Discharge status: Home / Self Care | Payer: Medicaid Other | Source: Ambulatory Visit | Attending: Obstetrics and Gynecology | Admitting: Obstetrics and Gynecology

## 2014-03-11 ENCOUNTER — Encounter (HOSPITAL_COMMUNITY): Payer: Self-pay | Admitting: *Deleted

## 2014-03-11 DIAGNOSIS — D279 Benign neoplasm of unspecified ovary: Secondary | ICD-10-CM | POA: Insufficient documentation

## 2014-03-11 DIAGNOSIS — O34599 Maternal care for other abnormalities of gravid uterus, unspecified trimester: Secondary | ICD-10-CM | POA: Insufficient documentation

## 2014-03-11 DIAGNOSIS — Z87891 Personal history of nicotine dependence: Secondary | ICD-10-CM | POA: Insufficient documentation

## 2014-03-11 DIAGNOSIS — R109 Unspecified abdominal pain: Secondary | ICD-10-CM | POA: Insufficient documentation

## 2014-03-11 DIAGNOSIS — R197 Diarrhea, unspecified: Secondary | ICD-10-CM | POA: Insufficient documentation

## 2014-03-11 DIAGNOSIS — D270 Benign neoplasm of right ovary: Secondary | ICD-10-CM

## 2014-03-11 DIAGNOSIS — O26899 Other specified pregnancy related conditions, unspecified trimester: Secondary | ICD-10-CM

## 2014-03-11 LAB — URINALYSIS, ROUTINE W REFLEX MICROSCOPIC
Bilirubin Urine: NEGATIVE
Glucose, UA: NEGATIVE mg/dL
Hgb urine dipstick: NEGATIVE
Ketones, ur: NEGATIVE mg/dL
LEUKOCYTES UA: NEGATIVE
NITRITE: NEGATIVE
PROTEIN: NEGATIVE mg/dL
SPECIFIC GRAVITY, URINE: 1.025 (ref 1.005–1.030)
Urobilinogen, UA: 0.2 mg/dL (ref 0.0–1.0)
pH: 6 (ref 5.0–8.0)

## 2014-03-11 LAB — CBC WITH DIFFERENTIAL/PLATELET
Basophils Absolute: 0 10*3/uL (ref 0.0–0.1)
Basophils Relative: 0 % (ref 0–1)
EOS PCT: 1 % (ref 0–5)
Eosinophils Absolute: 0.1 10*3/uL (ref 0.0–0.7)
HCT: 32.8 % — ABNORMAL LOW (ref 36.0–46.0)
Hemoglobin: 11.3 g/dL — ABNORMAL LOW (ref 12.0–15.0)
LYMPHS ABS: 2.7 10*3/uL (ref 0.7–4.0)
LYMPHS PCT: 31 % (ref 12–46)
MCH: 31.5 pg (ref 26.0–34.0)
MCHC: 34.5 g/dL (ref 30.0–36.0)
MCV: 91.4 fL (ref 78.0–100.0)
Monocytes Absolute: 0.5 10*3/uL (ref 0.1–1.0)
Monocytes Relative: 6 % (ref 3–12)
Neutro Abs: 5.2 10*3/uL (ref 1.7–7.7)
Neutrophils Relative %: 62 % (ref 43–77)
Platelets: 207 10*3/uL (ref 150–400)
RBC: 3.59 MIL/uL — AB (ref 3.87–5.11)
RDW: 12.7 % (ref 11.5–15.5)
WBC: 8.5 10*3/uL (ref 4.0–10.5)

## 2014-03-11 NOTE — MAU Note (Addendum)
Abd cramping on RLQ for few days. Comes and goes and is sharp. Diarrhea for last couple days

## 2014-03-11 NOTE — MAU Provider Note (Signed)
Chief Complaint: Abdominal Cramping   First Provider Initiated Contact with Patient 03/11/14 2332     SUBJECTIVE HPI: Misty Shaw is a 28 y.o. [id number] at [redacted]w[redacted]d by LMP pt who receives prenatal care in [place], [place], who presents to maternity admissions reporting RLQ x2-3 days, described as sharp and intermittent.  She reports the pain is worse after urinating, but is not affected by movement or worsened with walking.  She reports diarrhea x2-3 days, and other members of her household have also had GI symptoms.  She reports good fetal movement, denies LOF, vaginal bleeding, vaginal itching/burning, urinary symptoms, h/a, dizziness, n/v, or fever/chills.    Past Medical History  Diagnosis Date  . Depression     PPD after 2010 delivery  . Ovarian cyst   . Fibroid   . Headache(784.0)     migraines   Past Surgical History  Procedure Laterality Date  . Dilation and evacuation  11/06/2012    Procedure: DILATATION AND EVACUATION;  Surgeon: Mora Bellman, MD;  Location: [facility];  Service: Gynecology;  Laterality: N/A;   History   Social History  . Marital Status: Single    Spouse Name: N/A    Number of Children: N/A  . Years of Education: N/A   Occupational History  . Not on file.   Social History Main Topics  . Smoking status: Former Smoker -- 0.25 packs/day for 4 years    Types: Cigarettes  . Smokeless tobacco: Never Used     Comment: quit with + preg  . Alcohol Use: Yes     Comment: occasionally, not with preg  . Drug Use: No  . Sexual Activity: Yes    Birth Control/ Protection: None   Other Topics Concern  . Not on file   Social History Narrative  . No narrative on file   No current facility-administered medications on file prior to encounter.   Current Outpatient Prescriptions on File Prior to Encounter  Medication Sig Dispense Refill  . amoxicillin (AMOXIL) 500 MG capsule Take 500 mg by mouth 3 (three) times daily.      . Prenatal Vit-Fe Fumarate-FA (PRENATAL  MULTIVITAMIN) TABS tablet Take 1 tablet by mouth daily at 12 noon.      . promethazine (PHENERGAN) 25 MG tablet Take 25 mg by mouth every 6 (six) hours as needed for nausea or vomiting.       No Known Allergies  ROS: Pertinent items in HPI  OBJECTIVE Blood pressure 107/56, pulse 77, resp. rate 18, height 5\' 5"  (1.651 m), weight 75.751 kg (167 lb), last menstrual period 10/24/2013. GENERAL: Well-developed, well-nourished female in no acute distress.  HEENT: Normocephalic HEART: normal rate RESP: normal effort ABDOMEN: Soft, non-tender EXTREMITIES: Nontender, no edema NEURO: Alert and oriented Pelvic exam: Cervix pink, visually closed, without lesion, scant white creamy discharge, vaginal walls and external genitalia normal Bimanual exam: Cervix 0/long/high, firm, anterior, neg CMT, uterus nontender, ~19 week size, adnexa with mild tenderness on right, mild enlargement of right adnexa, normal left adnexa   FHT 145  LAB RESULTS Results for orders placed during the hospital encounter of 03/11/14 (from the past 24 hour(s))  URINALYSIS, ROUTINE W REFLEX MICROSCOPIC     Status: None   Collection Time    03/11/14  9:40 PM      Result Value Ref Range   Color, Urine YELLOW  YELLOW   APPearance CLEAR  CLEAR   Specific Gravity, Urine 1.025  1.005 - 1.030   pH 6.0  5.0 -  8.0   Glucose, UA NEGATIVE  NEGATIVE mg/dL   Hgb urine dipstick NEGATIVE  NEGATIVE   Bilirubin Urine NEGATIVE  NEGATIVE   Ketones, ur NEGATIVE  NEGATIVE mg/dL   Protein, ur NEGATIVE  NEGATIVE mg/dL   Urobilinogen, UA 0.2  0.0 - 1.0 mg/dL   Nitrite NEGATIVE  NEGATIVE   Leukocytes, UA NEGATIVE  NEGATIVE  CBC WITH DIFFERENTIAL     Status: Abnormal   Collection Time    03/11/14  9:55 PM      Result Value Ref Range   WBC 8.5  4.0 - 10.5 K/uL   RBC 3.59 (*) 3.87 - 5.11 MIL/uL   Hemoglobin 11.3 (*) 12.0 - 15.0 g/dL   HCT 32.8 (*) 36.0 - 46.0 %   MCV 91.4  78.0 - 100.0 fL   MCH 31.5  26.0 - 34.0 pg   MCHC 34.5  30.0 -  36.0 g/dL   RDW 12.7  11.5 - 15.5 %   Platelets 207  150 - 400 K/uL   Neutrophils Relative % 62  43 - 77 %   Neutro Abs 5.2  1.7 - 7.7 K/uL   Lymphocytes Relative 31  12 - 46 %   Lymphs Abs 2.7  0.7 - 4.0 K/uL   Monocytes Relative 6  3 - 12 %   Monocytes Absolute 0.5  0.1 - 1.0 K/uL   Eosinophils Relative 1  0 - 5 %   Eosinophils Absolute 0.1  0.0 - 0.7 K/uL   Basophils Relative 0  0 - 1 %   Basophils Absolute 0.0  0.0 - 0.1 K/uL  WET PREP, GENITAL     Status: Abnormal   Collection Time    03/12/14 12:15 AM      Result Value Ref Range   Yeast Wet Prep HPF POC NONE SEEN  NONE SEEN   Trich, Wet Prep NONE SEEN  NONE SEEN   Clue Cells Wet Prep HPF POC MODERATE (*) NONE SEEN   WBC, Wet Prep HPF POC MANY (*) NONE SEEN    IMAGING Preliminary report with no evidence of placental abnormality, normal cervical length, and dermoid cyst on right adnexa 1.8 cm x1.8 cm in size  ASSESSMENT 1. Dermoid cyst of right ovary   2. Abdominal pain in pregnancy     PLAN Discharge home F/U with prenatal provider in [place] to evaluate dermoid cyst Percocet 5/325, take 1-2 tabs @ 6 hours PRN x10 tabs Return to MAU as needed for emergencies    Medication List         amoxicillin 500 MG capsule  Commonly known as:  AMOXIL  Take 500 mg by mouth 3 (three) times daily.     oxyCODONE-acetaminophen 5-325 MG per tablet  Commonly known as:  PERCOCET/ROXICET  Take 1-2 tablets by mouth every 6 (six) hours as needed.     prenatal multivitamin Tabs tablet  Take 1 tablet by mouth daily at 12 noon.     promethazine 25 MG tablet  Commonly known as:  PHENERGAN  Take 25 mg by mouth every 6 (six) hours as needed for nausea or vomiting.       Follow-up Information   Follow up with [facility]. (As needed for emergencies.  Call your prenatal provider to discuss your visit today. )    Contact information:   [address] [id number] [place]  [address] [phone]      Fatima Blank Certified Nurse-Midwife 03/12/2014  1:41 AM

## 2014-03-11 NOTE — MAU Note (Signed)
Labs drawn while in triage

## 2014-03-12 ENCOUNTER — Inpatient Hospital Stay (HOSPITAL_COMMUNITY): Payer: Medicaid Other

## 2014-03-12 DIAGNOSIS — D279 Benign neoplasm of unspecified ovary: Secondary | ICD-10-CM

## 2014-03-12 LAB — WET PREP, GENITAL
TRICH WET PREP: NONE SEEN
Yeast Wet Prep HPF POC: NONE SEEN

## 2014-03-12 LAB — GC/CHLAMYDIA PROBE AMP
CT Probe RNA: NEGATIVE
GC Probe RNA: NEGATIVE

## 2014-03-12 IMAGING — US US OB TRANSVAGINAL
1 series · 14 of 28 positions shown · non-contrast
Comparison: 04/14/2013 and 03/25/2013

CLINICAL DATA: Spotting and cramping.

OBSTETRIC <14 WK ULTRASOUND TRANSVAGINAL
TECHNIQUE: Transabdominal ultrasound was performed for evaluation
of the gestation as well as the maternal uterus and adnexal
regions.

[Series 1: us ob transvaginal · 14 of 37 slices shown]
[im 2/37]
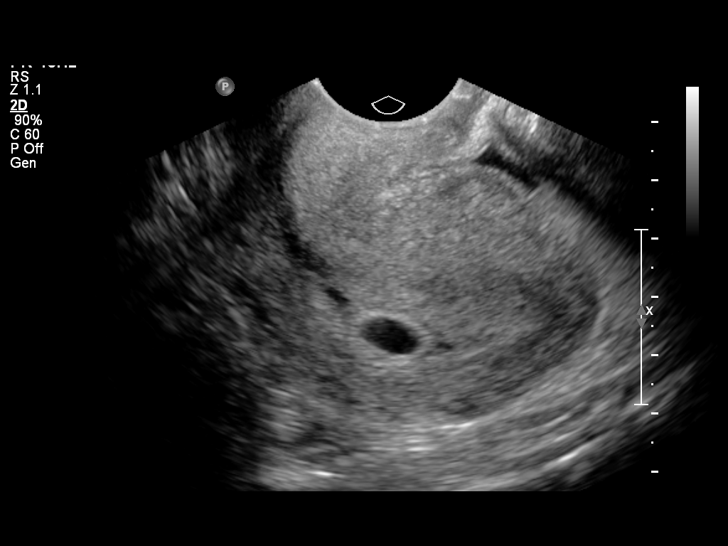
[im 5/37]
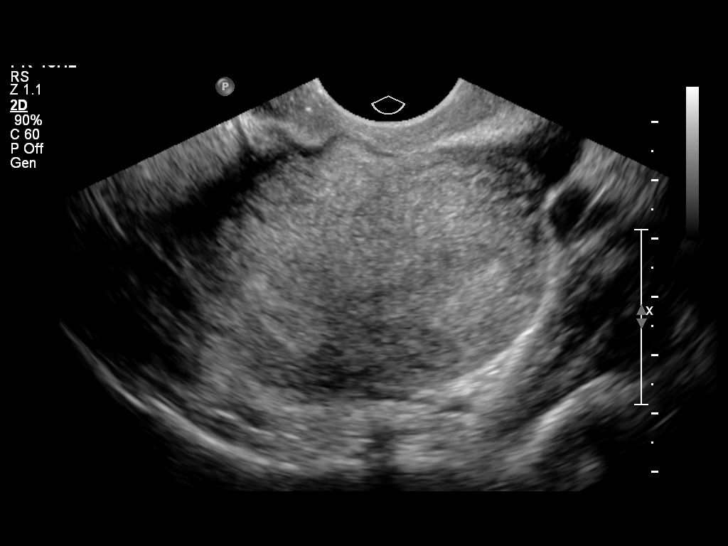
[im 7/37]
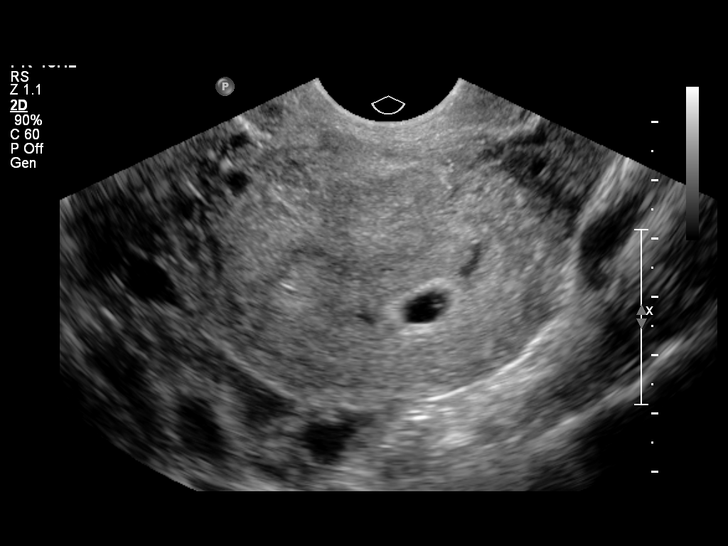
[im 10/37]
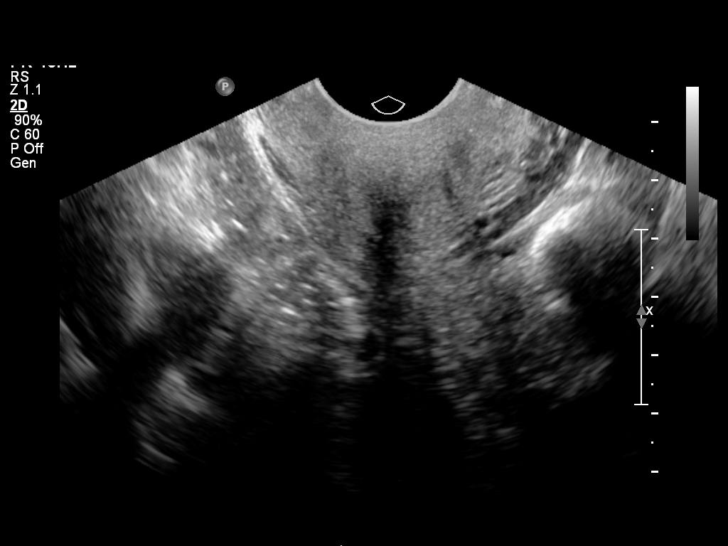
[im 13/37]
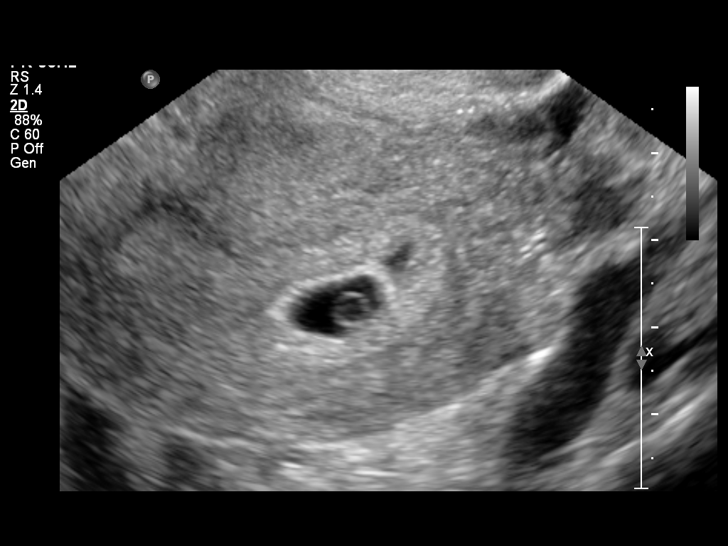
[im 15/37]
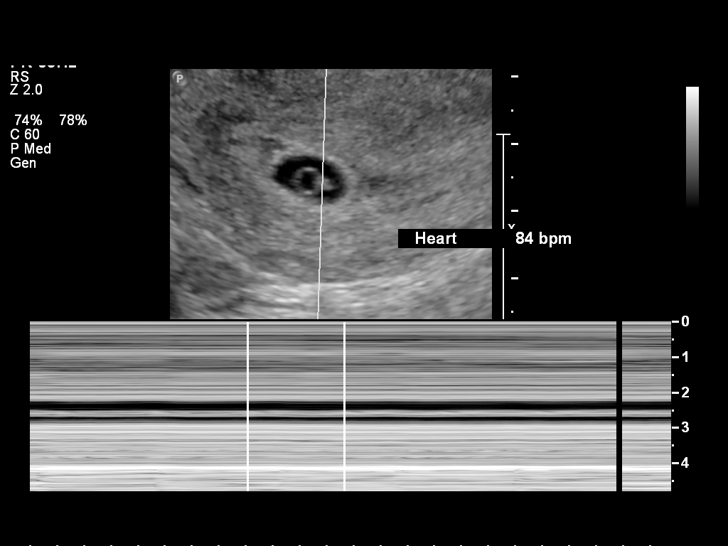
[im 18/37]
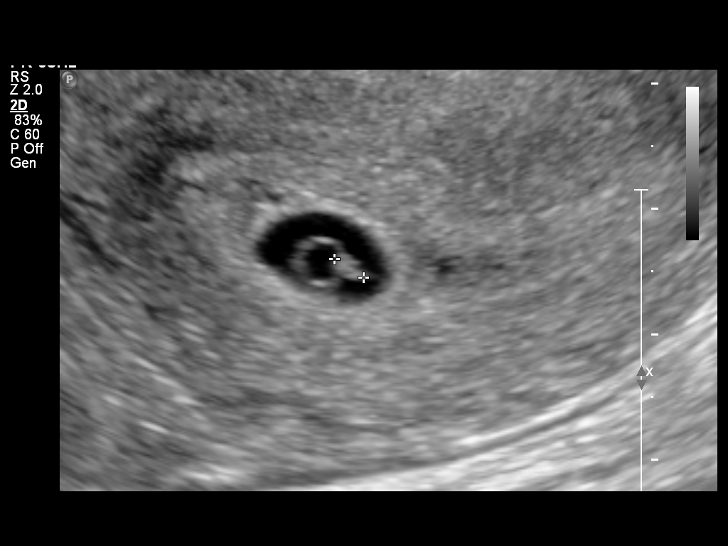
[im 21/37]
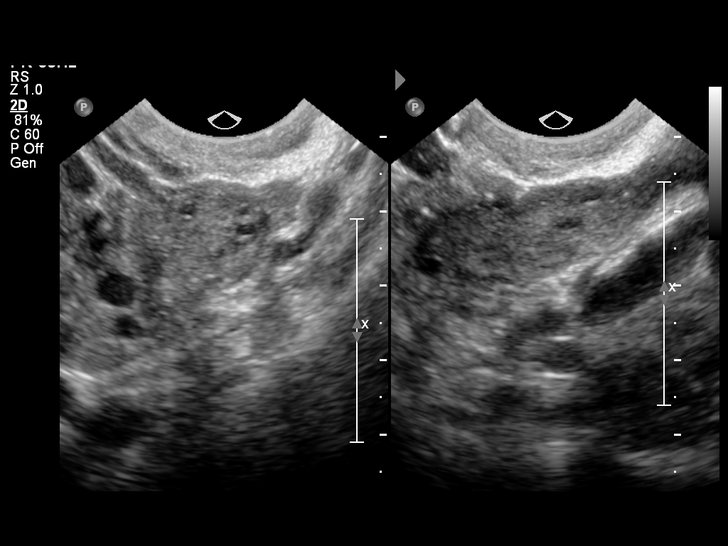
[im 23/37]
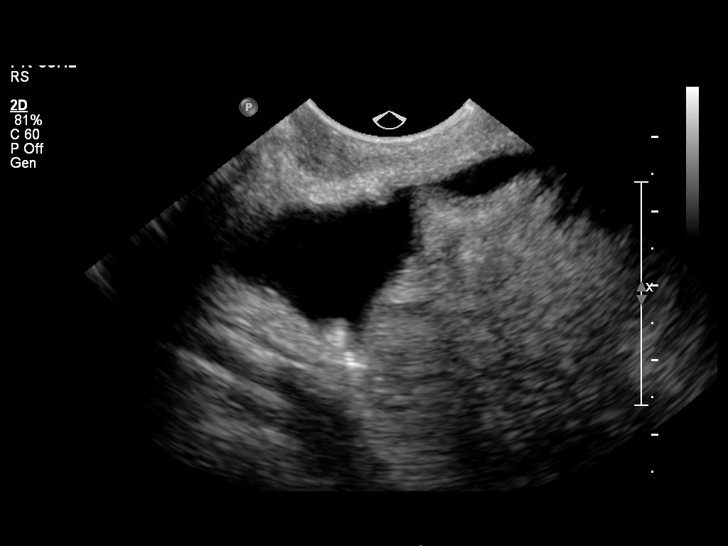
[im 26/37]
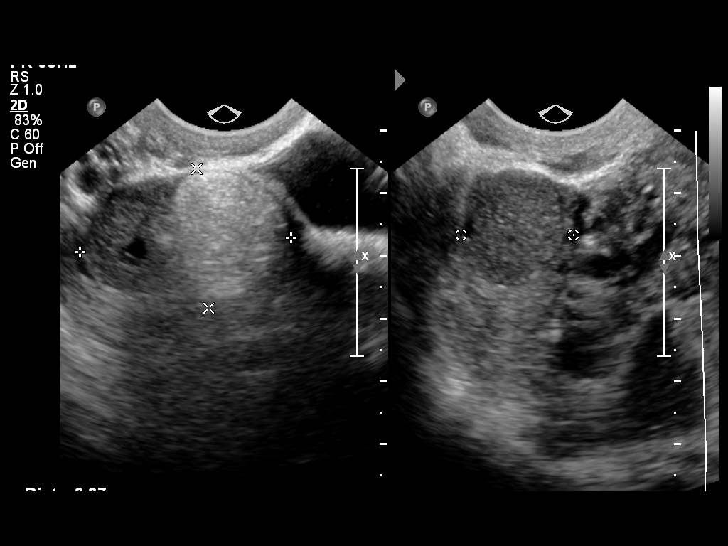
[im 29/37]
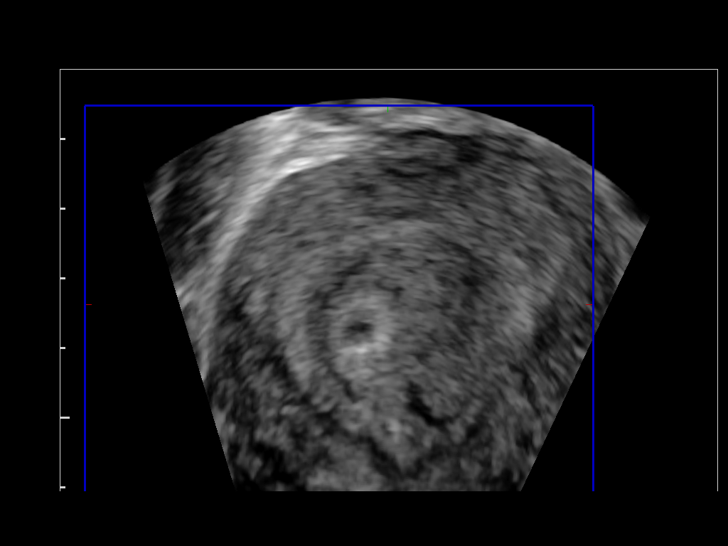
[im 31/37]
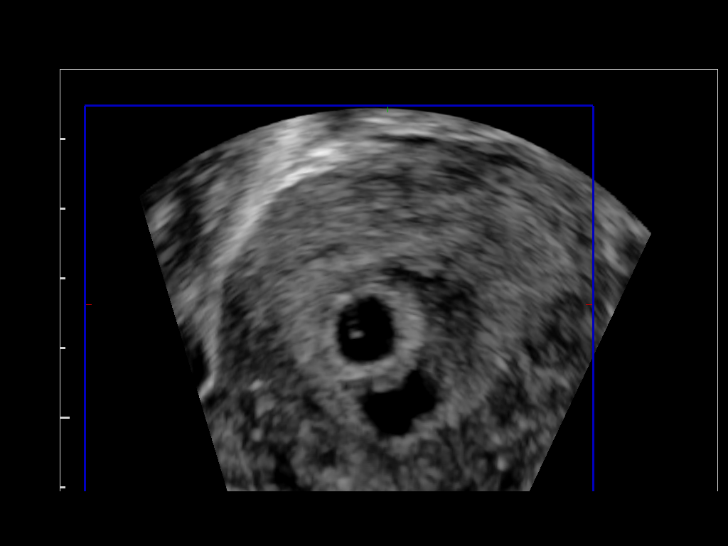
[im 34/37]
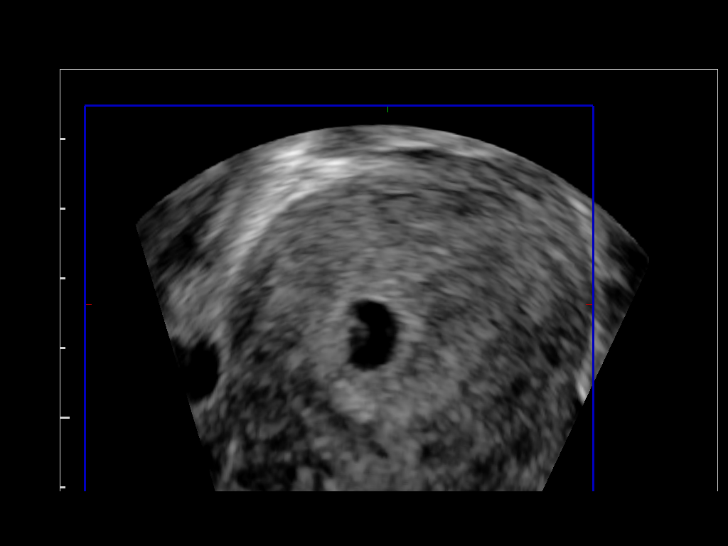
[im 37/37]
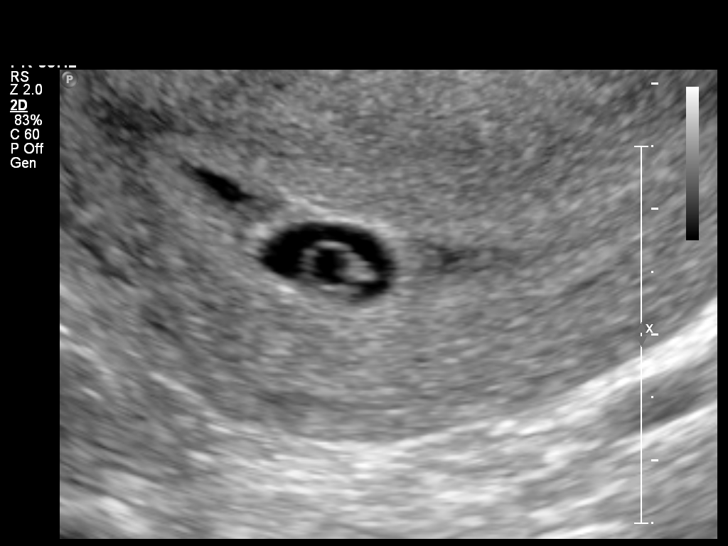

[14 of 28 positions shown; findings below may reference images not displayed]

Intrauterine gestational sac: Visualized/normal in shape.
Yolk sac: Present
Embryo: Present
Cardiac Activity: Present
Heart Rate: 84 bpm

CRL:  2.8 mm  6 w  0 d        US EDC: 12/14/2013

Maternal uterus/Adnexae:
Uterus is retroflexed.  There is an intrauterine gestational sac.
Fetal pole and yolk sac are present.  Evidence for a small
subchorionic hemorrhage.  Small amount of free fluid.  The right
ovary measures 3.4 x 2.3 x 1.8 cm. There is a focal echogenic
lesion involving the right ovary.  The echogenic lesion measures
1.8 x 2.0 x 1.6 cm. Findings are compatible with a dermoid and
similar to the previous exam.
IMPRESSION: Single live intrauterine pregnancy.  Calculated gestational age is
6 weeks and 0 days.

Stable appearance of the right ovarian dermoid.

## 2014-03-12 MED ORDER — OXYCODONE-ACETAMINOPHEN 5-325 MG PO TABS: 1.0000 | ORAL_TABLET | Freq: Four times a day (QID) | ORAL | Status: AC | PRN

## 2014-03-12 NOTE — Discharge Instructions (Signed)
Abdominal Pain During Pregnancy Abdominal pain is common in pregnancy. Most of the time, it does not cause harm. There are many causes of abdominal pain. Some causes are more serious than others. Some of the causes of abdominal pain in pregnancy are easily diagnosed. Occasionally, the diagnosis takes time to understand. Other times, the cause is not determined. Abdominal pain can be a sign that something is very wrong with the pregnancy, or the pain may have nothing to do with the pregnancy at all. For this reason, always tell your health care provider if you have any abdominal discomfort. HOME CARE INSTRUCTIONS  Monitor your abdominal pain for any changes. The following actions may help to alleviate any discomfort you are experiencing:  Do not have sexual intercourse or put anything in your vagina until your symptoms go away completely.  Get plenty of rest until your pain improves.  Drink clear fluids if you feel nauseous. Avoid solid food as long as you are uncomfortable or nauseous.  Only take over-the-counter or prescription medicine as directed by your health care provider.  Keep all follow-up appointments with your health care provider. SEEK IMMEDIATE MEDICAL CARE IF:  You are bleeding, leaking fluid, or passing tissue from the vagina.  You have increasing pain or cramping.  You have persistent vomiting.  You have painful or bloody urination.  You have a fever.  You notice a decrease in your baby's movements.  You have extreme weakness or feel faint.  You have shortness of breath, with or without abdominal pain.  You develop a severe headache with abdominal pain.  You have abnormal vaginal discharge with abdominal pain.  You have persistent diarrhea.  You have abdominal pain that continues even after rest, or gets worse. MAKE SURE YOU:   Understand these instructions.  Will watch your condition.  Will get help right away if you are not doing well or get  worse. Document Released: 09/23/2005 Document Revised: 07/14/2013 Document Reviewed: 04/22/2013 [facility] Patient Information 2014 [place], [place].  Ovarian Cyst An ovarian cyst is a fluid-filled sac that forms on an ovary. The ovaries are small organs that produce eggs in women. Various types of cysts can form on the ovaries. Most are not cancerous. Many do not cause problems, and they often go away on their own. Some may cause symptoms and require treatment. Common types of ovarian cysts include:  Functional cysts These cysts may occur every month during the menstrual cycle. This is normal. The cysts usually go away with the next menstrual cycle if the woman does not get pregnant. Usually, there are no symptoms with a functional cyst.  Endometrioma cysts These cysts form from the tissue that lines the uterus. They are also called "chocolate cysts" because they become filled with blood that turns brown. This type of cyst can cause pain in the lower abdomen during intercourse and with your menstrual period.  Cystadenoma cysts This type develops from the cells on the outside of the ovary. These cysts can get very big and cause lower abdomen pain and pain with intercourse. This type of cyst can twist on itself, cut off its blood supply, and cause severe pain. It can also easily rupture and cause a lot of pain.  Dermoid cysts This type of cyst is sometimes found in both ovaries. These cysts may contain different kinds of body tissue, such as skin, teeth, hair, or cartilage. They usually do not cause symptoms unless they get very big.  Theca lutein cysts These cysts occur  when too much of a certain hormone (human chorionic gonadotropin) is produced and overstimulates the ovaries to produce an egg. This is most common after procedures used to assist with the conception of a baby (in vitro fertilization). CAUSES   Fertility drugs can cause a condition in which multiple large cysts are formed on the  ovaries. This is called ovarian hyperstimulation syndrome.  A condition called polycystic ovary syndrome can cause hormonal imbalances that can lead to nonfunctional ovarian cysts. SIGNS AND SYMPTOMS  Many ovarian cysts do not cause symptoms. If symptoms are present, they may include:  Pelvic pain or pressure.  Pain in the lower abdomen.  Pain during sexual intercourse.  Increasing girth (swelling) of the abdomen.  Abnormal menstrual periods.  Increasing pain with menstrual periods.  Stopping having menstrual periods without being pregnant. DIAGNOSIS  These cysts are commonly found during a routine or annual pelvic exam. Tests may be ordered to find out more about the cyst. These tests may include:  Ultrasound.  X-ray of the pelvis.  CT scan.  MRI.  Blood tests. TREATMENT  Many ovarian cysts go away on their own without treatment. Your health care provider may want to check your cyst regularly for 2 3 months to see if it changes. For women in menopause, it is particularly important to monitor a cyst closely because of the higher rate of ovarian cancer in menopausal women. When treatment is needed, it may include any of the following:  A procedure to drain the cyst (aspiration). This may be done using a long needle and ultrasound. It can also be done through a laparoscopic procedure. This involves using a thin, lighted tube with a tiny camera on the end (laparoscope) inserted through a small incision.  Surgery to remove the whole cyst. This may be done using laparoscopic surgery or an open surgery involving a larger incision in the lower abdomen.  Hormone treatment or birth control pills. These methods are sometimes used to help dissolve a cyst. HOME CARE INSTRUCTIONS   Only take over-the-counter or prescription medicines as directed by your health care provider.  Follow up with your health care provider as directed.  Get regular pelvic exams and Pap tests. SEEK MEDICAL  CARE IF:   Your periods are late, irregular, or painful, or they stop.  Your pelvic pain or abdominal pain does not go away.  Your abdomen becomes larger or swollen.  You have pressure on your bladder or trouble emptying your bladder completely.  You have pain during sexual intercourse.  You have feelings of fullness, pressure, or discomfort in your stomach.  You lose weight for no apparent reason.  You feel generally ill.  You become constipated.  You lose your appetite.  You develop acne.  You have an increase in body and facial hair.  You are gaining weight, without changing your exercise and eating habits.  You think you are pregnant. SEEK IMMEDIATE MEDICAL CARE IF:   You have increasing abdominal pain.  You feel sick to your stomach (nauseous), and you throw up (vomit).  You develop a fever that comes on suddenly.  You have abdominal pain during a bowel movement.  Your menstrual periods become heavier than usual. Document Released: 09/23/2005 Document Revised: 07/14/2013 Document Reviewed: 05/31/2013 [facility] Patient Information 2014 [place].

## 2014-03-12 NOTE — MAU Provider Note (Signed)
Attestation of Attending Supervision of Advanced Practitioner: Evaluation and management procedures were performed by the PA/NP/CNM/OB Fellow under my supervision/collaboration. Chart reviewed and agree with management and plan.  Mallory Shirk V 03/12/2014 2:31 PM

## 2014-08-08 ENCOUNTER — Encounter (HOSPITAL_COMMUNITY): Payer: Self-pay | Admitting: *Deleted

## 2015-01-02 ENCOUNTER — Encounter (HOSPITAL_COMMUNITY): Payer: Self-pay | Admitting: *Deleted

## 2016-06-15 ENCOUNTER — Encounter: Payer: Self-pay | Admitting: *Deleted

## 2016-06-15 LAB — LAB REPORT - SCANNED

## 2024-03-23 ENCOUNTER — Other Ambulatory Visit: Payer: Self-pay

## 2024-03-23 ENCOUNTER — Emergency Department (HOSPITAL_COMMUNITY)
Admission: EM | Admit: 2024-03-23 | Discharge: 2024-03-23 | Disposition: A | Discharge status: Home / Self Care | Payer: Self-pay | Attending: Emergency Medicine | Admitting: Emergency Medicine

## 2024-03-23 ENCOUNTER — Encounter (HOSPITAL_COMMUNITY): Payer: Self-pay | Admitting: Emergency Medicine

## 2024-03-23 DIAGNOSIS — L729 Follicular cyst of the skin and subcutaneous tissue, unspecified: Secondary | ICD-10-CM | POA: Insufficient documentation

## 2024-03-23 DIAGNOSIS — L089 Local infection of the skin and subcutaneous tissue, unspecified: Secondary | ICD-10-CM | POA: Insufficient documentation

## 2024-03-23 MED ORDER — DOXYCYCLINE HYCLATE 100 MG PO TABS
100.0000 mg | ORAL_TABLET | Freq: Once | ORAL | Status: AC
  Administered 2024-03-23: 100 mg via ORAL
  Filled 2024-03-23: qty 1

## 2024-03-23 MED ORDER — LIDOCAINE-EPINEPHRINE (PF) 2 %-1:200000 IJ SOLN
10.0000 mL | Freq: Once | INTRAMUSCULAR | Status: AC
  Administered 2024-03-23: 10 mL
  Filled 2024-03-23: qty 20

## 2024-03-23 NOTE — ED Provider Notes (Signed)
 [place] EMERGENCY DEPARTMENT AT [facility] Provider Note   CSN: [id number] Arrival date & time: 03/23/24  2045     Patient presents with: Cyst   Misty Shaw is a 38 y.o. female with overall noncontributory past medical history presents with concern for cyst versus infection on anterior neck.  Getting bigger over the last few weeks.  She denies any fever, nausea, vomiting.  No injury.  No history of thyroid disease.  No other abnormal symptoms other than some pain at the site.   HPI     Prior to Admission medications   Medication Sig Start Date End Date Taking? Authorizing Provider  amoxicillin (AMOXIL) 500 MG capsule Take 500 mg by mouth 3 (three) times daily. 02/25/14   [provider]  oxyCODONE -acetaminophen  (PERCOCET/ROXICET) 5-325 MG per tablet Take 1-2 tablets by mouth every 6 (six) hours as needed. 03/12/14   Leftwich-Kirby, Darren Em, CNM  Prenatal Vit-Fe Fumarate-FA (PRENATAL MULTIVITAMIN) TABS tablet Take 1 tablet by mouth daily at 12 noon.    [provider]  promethazine  (PHENERGAN ) 25 MG tablet Take 25 mg by mouth every 6 (six) hours as needed for nausea or vomiting.    [provider]    Allergies: Patient has no known allergies.    Review of Systems  All other systems reviewed and are negative.   Updated Vital Signs BP (!) 162/75   Pulse 75   Temp 98.8 F (37.1 C)   Resp 18   SpO2 100%   Physical Exam Vitals and nursing note reviewed.  Constitutional:      General: She is not in acute distress.    Appearance: Normal appearance.  HENT:     Head: Normocephalic and atraumatic.   Eyes:     General:        Right eye: No discharge.        Left eye: No discharge.    Cardiovascular:     Rate and Rhythm: Normal rate and regular rhythm.  Pulmonary:     Effort: Pulmonary effort is normal. No respiratory distress.   Musculoskeletal:        General: No deformity.   Skin:    General: Skin is warm and dry.      Comments: Very small cyst versus abscess on the anterior neck, around 1 to 2 cm.  Fluctuant, with some surrounding redness.  Not actively draining at this time.  Appears localized around central pore of probable ingrown hair.   Neurological:     Mental Status: She is alert and oriented to person, place, and time.   Psychiatric:        Mood and Affect: Mood normal.        Behavior: Behavior normal.     (all labs ordered are listed, but only abnormal results are displayed) Labs Reviewed - No data to display  EKG: None  Radiology: No results found.   .Incision and Drainage  Date/Time: 03/23/2024 10:33 PM  Performed by: Nelly Banco, PA-C Authorized by: Nelly Banco, PA-C   Consent:    Consent obtained:  Verbal   Consent given by:  Patient   Risks, benefits, and alternatives were discussed: yes     Risks discussed:  Bleeding, pain, infection, incomplete drainage and damage to other organs   Alternatives discussed:  No treatment Universal protocol:    Procedure explained and questions answered to patient or proxy's satisfaction: yes     Patient identity confirmed:  Verbally with patient  Location:    Type:  Abscess   Size:  1x2cm   Location:  Neck   Neck location: midline anterior. Pre-procedure details:    Skin preparation:  Povidone-iodine Anesthesia:    Anesthesia method:  Local infiltration   Local anesthetic:  Lidocaine  2% WITH epi Procedure type:    Complexity:  Simple Procedure details:    Incision types:  Stab incision   Incision depth:  Dermal   Wound management:  Probed and deloculated   Drainage:  Bloody   Drainage amount:  Scant   Wound treatment:  Wound left open   Packing materials:  None Post-procedure details:    Procedure completion:  Tolerated    Medications Ordered in the ED  doxycycline  (VIBRA -TABS) tablet 100 mg (has no administration in time range)  lidocaine -EPINEPHrine (XYLOCAINE  W/EPI) 2 %-1:200000 (PF) injection 10 mL  (10 mLs Infiltration Given by Other 03/23/24 2218)                                    Medical Decision Making  This patient is a 38 y.o. female who presents to the ED for concern of cyst on neck versus abscess, versus cellulitis..   Differential diagnoses prior to evaluation: Thyroid abnormality, cyst, abscess, Ludwig angina, peritonsillar retropharyngeal abscess, noninfected epidermal inclusion cyst versus other  Past Medical History / Social History / Additional history: Chart reviewed. Pertinent results include: noncontributory  Medications / Treatment: On exam patient with appearance of Very small cyst versus abscess on the anterior neck, around 1 to 2 cm.  Fluctuant, with some surrounding redness.  Not actively draining at this time.  Appears localized around central pore of probable ingrown hair.   Given the fluctuance, surrounding redness I do think that it would benefit from incision and drainage.  Will plan to treat, treated as above, discharged on short course of antibiotics, encouraged PCP follow-up.   Disposition: After consideration of the diagnostic results and the patients response to treatment, I feel that patient stable for discharge, will discharge with antibiotics, ENT follow-up as needed.   emergency department workup does not suggest an emergent condition requiring admission or immediate intervention beyond what has been performed at this time. The plan is: as above. The patient is safe for discharge and has been instructed to return immediately for worsening symptoms, change in symptoms or any other concerns.   Final diagnoses:  Infected cyst of skin    ED Discharge Orders     None          Stefan Edge 03/23/24 2235    Iva Mariner, MD 03/23/24 [phone]

## 2024-03-23 NOTE — Discharge Instructions (Addendum)
 Please use Tylenol  or ibuprofen  for pain.  You may use 600 mg ibuprofen  every 6 hours or 1000 mg of Tylenol  every 6 hours.  You may choose to alternate between the 2.  This would be most effective.  Not to exceed 4 g of Tylenol  within 24 hours.  Not to exceed 3200 mg ibuprofen  24 hours.   please take the entire course of antibiotics that are prescribed.  Please follow-up with ENT if your cyst returns or you have incomplete resolution of the pain and swelling after drainage today.

## 2024-03-23 NOTE — ED Triage Notes (Signed)
 Patient report a cyst on her neck is getting bigger. Patient denies fever. Patient denies N/V.

## 2024-03-24 ENCOUNTER — Telehealth (HOSPITAL_COMMUNITY): Payer: Self-pay | Admitting: Emergency Medicine

## 2024-03-24 ENCOUNTER — Telehealth (HOSPITAL_COMMUNITY): Payer: Self-pay

## 2024-03-24 MED ORDER — DOXYCYCLINE HYCLATE 100 MG PO CAPS: 100.0000 mg | ORAL_CAPSULE | Freq: Two times a day (BID) | ORAL | 0 refills | Status: AC

## 2024-03-24 NOTE — Telephone Encounter (Cosign Needed)
 Nursing staff reportedly received a call from the patient stating that she been told to take doxycycline , however did not have his prescription sent in for this medicine.  Chart reviewed, it does appear she was supposed to be prescribed doxycycline  however no prescription for this was sent. I have sent medication for same. No other needs at this time

## 2024-08-28 ENCOUNTER — Emergency Department (HOSPITAL_COMMUNITY)
Admission: EM | Admit: 2024-08-28 | Discharge: 2024-08-28 | Disposition: A | Discharge status: Home / Self Care | Attending: Emergency Medicine | Admitting: Emergency Medicine

## 2024-08-28 ENCOUNTER — Emergency Department (HOSPITAL_COMMUNITY)

## 2024-08-28 DIAGNOSIS — M25522 Pain in left elbow: Secondary | ICD-10-CM | POA: Insufficient documentation

## 2024-08-28 DIAGNOSIS — Y92410 Unspecified street and highway as the place of occurrence of the external cause: Secondary | ICD-10-CM | POA: Diagnosis not present

## 2024-08-28 DIAGNOSIS — M79622 Pain in left upper arm: Secondary | ICD-10-CM | POA: Diagnosis not present

## 2024-08-28 MED ORDER — METHOCARBAMOL 500 MG PO TABS: 500.0000 mg | ORAL_TABLET | Freq: Two times a day (BID) | ORAL | 0 refills | Status: AC

## 2024-08-28 MED ORDER — NAPROXEN 500 MG PO TABS
500.0000 mg | ORAL_TABLET | Freq: Once | ORAL | Status: AC
  Administered 2024-08-28: 500 mg via ORAL
  Filled 2024-08-28: qty 1

## 2024-08-28 MED ORDER — NAPROXEN 500 MG PO TABS: 500.0000 mg | ORAL_TABLET | Freq: Two times a day (BID) | ORAL | 0 refills | Status: AC

## 2024-08-28 MED ORDER — ACETAMINOPHEN 325 MG PO TABS
650.0000 mg | ORAL_TABLET | Freq: Once | ORAL | Status: AC
  Administered 2024-08-28: 650 mg via ORAL
  Filled 2024-08-28: qty 2
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
## 2024-08-28 NOTE — ED Notes (Signed)
 Patient transported to radiology
